# Patient Record
Sex: Male | Born: 1965 | Race: White | Hispanic: No | Marital: Single | Smoking: Never smoker
Health system: Southern US, Community
[De-identification: ages and names within clinical notes are randomized; demographics above are authoritative.]

## PROBLEM LIST (undated history)

## (undated) DIAGNOSIS — R9389 Abnormal findings on diagnostic imaging of other specified body structures: Secondary | ICD-10-CM

## (undated) DIAGNOSIS — M51369 Other intervertebral disc degeneration, lumbar region without mention of lumbar back pain or lower extremity pain: Secondary | ICD-10-CM

## (undated) DIAGNOSIS — D869 Sarcoidosis, unspecified: Secondary | ICD-10-CM

## (undated) DIAGNOSIS — M5136 Other intervertebral disc degeneration, lumbar region: Secondary | ICD-10-CM

## (undated) DIAGNOSIS — M5126 Other intervertebral disc displacement, lumbar region: Secondary | ICD-10-CM

## (undated) HISTORY — DX: Other intervertebral disc displacement, lumbar region: M51.26

## (undated) HISTORY — PX: TONSILLECTOMY: SUR1361

## (undated) HISTORY — PX: CATARACT EXTRACTION: SUR2

## (undated) HISTORY — DX: Other intervertebral disc degeneration, lumbar region without mention of lumbar back pain or lower extremity pain: M51.369

## (undated) HISTORY — DX: Other intervertebral disc degeneration, lumbar region: M51.36

---

## 2000-09-09 HISTORY — PX: REFRACTIVE SURGERY: SHX103

## 2014-10-18 ENCOUNTER — Encounter (INDEPENDENT_AMBULATORY_CARE_PROVIDER_SITE_OTHER): Payer: Self-pay

## 2014-10-18 ENCOUNTER — Encounter: Payer: Self-pay | Admitting: Pulmonary Disease

## 2014-10-18 ENCOUNTER — Ambulatory Visit (INDEPENDENT_AMBULATORY_CARE_PROVIDER_SITE_OTHER): Payer: BLUE CROSS/BLUE SHIELD | Admitting: Pulmonary Disease

## 2014-10-18 VITALS — BP 100/62 | HR 62 | Temp 97.9°F | Ht 73.0 in | Wt 202.4 lb

## 2014-10-18 DIAGNOSIS — R590 Localized enlarged lymph nodes: Secondary | ICD-10-CM

## 2014-10-18 DIAGNOSIS — R599 Enlarged lymph nodes, unspecified: Secondary | ICD-10-CM

## 2014-10-18 DIAGNOSIS — D869 Sarcoidosis, unspecified: Secondary | ICD-10-CM | POA: Insufficient documentation

## 2014-10-18 NOTE — Progress Notes (Signed)
   Subjective:    Patient ID: Alexander Nelson, male    DOB: October 13, 1965, 49 y.o.   MRN: [id number]  HPI The patient is a 49 year old male who I've been asked to see for an abnormal chest x-ray. He was in his usual state of health until approximately 2 months ago when he began to notice visual changes. 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 Blood work was done with an ACE level of 91, and his HIV was nonreactive. The patient then had a chest x-ray which showed bilateral hilar lymphadenopathy, and possibly mediastinal adenopathy. The patient has no significant pulmonary symptoms, and feels that his exertional tolerance is completely normal. He does have a mild dry and tickling cough that comes and goes over the last 6 months. 6 is not overly bothersome to him. He denies any skin changes, joint complaints, or night sweats. The patient does work in [employer], but does not work around [employer].   Review of Systems  Constitutional: Negative for fever and unexpected weight change.  HENT: Negative for congestion, dental problem, ear pain, nosebleeds, postnasal drip, rhinorrhea, sinus pressure, sneezing, sore throat and trouble swallowing.   Eyes: Positive for visual disturbance. Negative for redness and itching.  Respiratory: Positive for cough. Negative for chest tightness, shortness of breath and wheezing.   Cardiovascular: Negative for palpitations and leg swelling.  Gastrointestinal: Negative for nausea and vomiting.  Genitourinary: Negative for dysuria.  Musculoskeletal: Negative for joint swelling.  Skin: Negative for rash.  Neurological: Negative for headaches.  Hematological: Does not bruise/bleed easily.  Psychiatric/Behavioral: Negative for dysphoric mood. The patient is not nervous/anxious.        Objective:   Physical Exam Constitutional:  Well developed, no acute  distress  HENT:  Nares patent without discharge  Oropharynx without exudate, palate and uvula are normal  Eyes:  Perrla, eomi, no scleral icterus  Neck:  No JVD, no TMG  Cardiovascular:  Normal rate, regular rhythm, no rubs or gallops.  No murmurs        Intact distal pulses  Pulmonary :  Normal breath sounds, no stridor or respiratory distress   No rales, rhonchi, or wheezing  Abdominal:  Soft, nondistended, bowel sounds present.  No tenderness noted.   Musculoskeletal:  No lower extremity edema noted.  Lymph Nodes:  No cervical lymphadenopathy noted  Skin:  No cyanosis noted  Neurologic:  Alert, appropriate, moves all 4 extremities without obvious deficit.         Assessment & Plan:

## 2014-10-18 NOTE — Assessment & Plan Note (Signed)
The patient has classic uveitis, mediastinal and hilar lymphadenopathy, and an elevated ACE level. This is all most consistent with sarcoidosis. He has no pulmonary symptoms at this time except minimal cough, and it is unclear whether these 2 are even related. He has no constitutional symptoms, and feels that his breathing is excellent. At this point, I think he needs to have a CT chest to better evaluate his pulmonary abnormalities. Depending upon the results, we will need to proceed with a tissue diagnosis if the patient is agreeable. He and I have had a long conversation about whether a tissue diagnosis is necessary, and I have reviewed with him the various diseases that can mimic sarcoidosis. The patient does work in [employer], but is not involved directly in [employer] which could predispose him to berylliosis.

## 2014-10-18 NOTE — Patient Instructions (Signed)
Will schedule for ct chest at your convenience, and call you with results. Let us know if you feel your cough is worsening.

## 2014-10-27 ENCOUNTER — Ambulatory Visit (INDEPENDENT_AMBULATORY_CARE_PROVIDER_SITE_OTHER)
Admission: RE | Admit: 2014-10-27 | Discharge: 2014-10-27 | Disposition: A | Discharge status: Home / Self Care | Payer: BLUE CROSS/BLUE SHIELD | Source: Ambulatory Visit | Attending: Pulmonary Disease | Admitting: Pulmonary Disease

## 2014-10-27 DIAGNOSIS — R590 Localized enlarged lymph nodes: Secondary | ICD-10-CM

## 2014-10-27 DIAGNOSIS — R599 Enlarged lymph nodes, unspecified: Secondary | ICD-10-CM

## 2014-10-27 MED ORDER — IOHEXOL 300 MG/ML  SOLN
80.0000 mL | Freq: Once | INTRAMUSCULAR | Status: AC | PRN
  Administered 2014-10-27: 80 mL via INTRAVENOUS

## 2014-11-03 ENCOUNTER — Telehealth: Payer: Self-pay | Admitting: Pulmonary Disease

## 2014-11-03 NOTE — Telephone Encounter (Signed)
EBUS 11/14/14@7 :30am pt aware Alexander Nelson

## 2014-11-03 NOTE — Telephone Encounter (Signed)
Pt is wanting to proceed with EBUS.  KC - please advise. Thanks.

## 2014-11-03 NOTE — Telephone Encounter (Signed)
He would prefer March 7 (monday) at 730 am. Almyra FreeLibby, please check on this for me.

## 2014-11-04 ENCOUNTER — Encounter (HOSPITAL_COMMUNITY): Payer: Self-pay | Admitting: *Deleted

## 2014-11-14 ENCOUNTER — Other Ambulatory Visit: Payer: Self-pay

## 2014-11-14 ENCOUNTER — Encounter (HOSPITAL_COMMUNITY)
Admission: RE | Disposition: A | Discharge status: Home / Self Care | Payer: BLUE CROSS/BLUE SHIELD | Source: Ambulatory Visit | Attending: Pulmonary Disease

## 2014-11-14 ENCOUNTER — Ambulatory Visit (HOSPITAL_COMMUNITY): Payer: BLUE CROSS/BLUE SHIELD | Admitting: Anesthesiology

## 2014-11-14 ENCOUNTER — Encounter (HOSPITAL_COMMUNITY): Payer: Self-pay | Admitting: Anesthesiology

## 2014-11-14 ENCOUNTER — Ambulatory Visit (HOSPITAL_COMMUNITY)
Admission: RE | Admit: 2014-11-14 | Discharge: 2014-11-14 | Disposition: A | Discharge status: Home / Self Care | Payer: BLUE CROSS/BLUE SHIELD | Source: Ambulatory Visit | Attending: Pulmonary Disease | Admitting: Pulmonary Disease

## 2014-11-14 DIAGNOSIS — R591 Generalized enlarged lymph nodes: Secondary | ICD-10-CM | POA: Diagnosis present

## 2014-11-14 DIAGNOSIS — R599 Enlarged lymph nodes, unspecified: Secondary | ICD-10-CM

## 2014-11-14 HISTORY — DX: Abnormal findings on diagnostic imaging of other specified body structures: R93.89

## 2014-11-14 HISTORY — PX: ENDOBRONCHIAL ULTRASOUND: SHX5096

## 2014-11-14 LAB — COMPREHENSIVE METABOLIC PANEL
ALT: 17 U/L (ref 0–53)
AST: 19 U/L (ref 0–37)
Albumin: 3.8 g/dL (ref 3.5–5.2)
Alkaline Phosphatase: 52 U/L (ref 39–117)
Anion gap: 5 (ref 5–15)
BUN: 15 mg/dL (ref 6–23)
CO2: 28 mmol/L (ref 19–32)
Calcium: 8.7 mg/dL (ref 8.4–10.5)
Chloride: 105 mmol/L (ref 96–112)
Creatinine, Ser: 0.9 mg/dL (ref 0.50–1.35)
GFR calc Af Amer: >90 mL/min (ref >90)
GFR calc non Af Amer: >90 mL/min (ref >90)
Glucose, Bld: 136 mg/dL — ABNORMAL HIGH (ref 70–99)
Potassium: 4.1 mmol/L (ref 3.5–5.1)
Sodium: 138 mmol/L (ref 135–145)
Total Bilirubin: 0.6 mg/dL (ref 0.3–1.2)
Total Protein: 6.7 g/dL (ref 6.0–8.3)

## 2014-11-14 SURGERY — ENDOBRONCHIAL ULTRASOUND (EBUS)
Anesthesia: General | Laterality: Bilateral

## 2014-11-14 MED ORDER — ONDANSETRON HCL 4 MG/2ML IJ SOLN
INTRAMUSCULAR | Status: AC
  Filled 2014-11-14: qty 2

## 2014-11-14 MED ORDER — LACTATED RINGERS IV SOLN
INTRAVENOUS | Status: DC
  Administered 2014-11-14: 1000 mL via INTRAVENOUS

## 2014-11-14 MED ORDER — LIDOCAINE HCL (CARDIAC) 20 MG/ML IV SOLN
INTRAVENOUS | Status: DC | PRN
  Administered 2014-11-14: 50 mg via INTRAVENOUS

## 2014-11-14 MED ORDER — PROPOFOL 10 MG/ML IV BOLUS
INTRAVENOUS | Status: AC
  Filled 2014-11-14: qty 20

## 2014-11-14 MED ORDER — MIDAZOLAM HCL 2 MG/2ML IJ SOLN
INTRAMUSCULAR | Status: AC
  Filled 2014-11-14: qty 2

## 2014-11-14 MED ORDER — ROCURONIUM BROMIDE 100 MG/10ML IV SOLN
INTRAVENOUS | Status: DC | PRN
  Administered 2014-11-14: 25 mg via INTRAVENOUS

## 2014-11-14 MED ORDER — LACTATED RINGERS IV SOLN
INTRAVENOUS | Status: DC | PRN
  Administered 2014-11-14: 08:00:00 via INTRAVENOUS

## 2014-11-14 MED ORDER — GLYCOPYRROLATE 0.2 MG/ML IJ SOLN
INTRAMUSCULAR | Status: AC
  Filled 2014-11-14: qty 2

## 2014-11-14 MED ORDER — DEXAMETHASONE SODIUM PHOSPHATE 10 MG/ML IJ SOLN
INTRAMUSCULAR | Status: AC
  Filled 2014-11-14: qty 1

## 2014-11-14 MED ORDER — NEOSTIGMINE METHYLSULFATE 10 MG/10ML IV SOLN
INTRAVENOUS | Status: DC | PRN
  Administered 2014-11-14: 3 mg via INTRAVENOUS

## 2014-11-14 MED ORDER — FENTANYL CITRATE 0.05 MG/ML IJ SOLN
INTRAMUSCULAR | Status: AC
  Filled 2014-11-14: qty 5

## 2014-11-14 MED ORDER — PROPOFOL 10 MG/ML IV BOLUS
INTRAVENOUS | Status: DC | PRN
  Administered 2014-11-14: 190 mg via INTRAVENOUS

## 2014-11-14 MED ORDER — MIDAZOLAM HCL 5 MG/5ML IJ SOLN
INTRAMUSCULAR | Status: DC | PRN
  Administered 2014-11-14: 2 mg via INTRAVENOUS

## 2014-11-14 MED ORDER — GLYCOPYRROLATE 0.2 MG/ML IJ SOLN
INTRAMUSCULAR | Status: DC | PRN
  Administered 2014-11-14: 0.4 mg via INTRAVENOUS
  Administered 2014-11-14: 0.2 mg via INTRAVENOUS

## 2014-11-14 MED ORDER — EPHEDRINE SULFATE 50 MG/ML IJ SOLN
INTRAMUSCULAR | Status: AC
  Filled 2014-11-14: qty 1

## 2014-11-14 MED ORDER — ROCURONIUM BROMIDE 100 MG/10ML IV SOLN
INTRAVENOUS | Status: AC
  Filled 2014-11-14: qty 1

## 2014-11-14 MED ORDER — DEXAMETHASONE SODIUM PHOSPHATE 10 MG/ML IJ SOLN
INTRAMUSCULAR | Status: DC | PRN
  Administered 2014-11-14: 10 mg via INTRAVENOUS

## 2014-11-14 MED ORDER — SODIUM CHLORIDE 0.9 % IJ SOLN
INTRAMUSCULAR | Status: AC
  Filled 2014-11-14: qty 10

## 2014-11-14 MED ORDER — ONDANSETRON HCL 4 MG/2ML IJ SOLN
INTRAMUSCULAR | Status: DC | PRN
  Administered 2014-11-14: 4 mg via INTRAVENOUS

## 2014-11-14 MED ORDER — FENTANYL CITRATE 0.05 MG/ML IJ SOLN
INTRAMUSCULAR | Status: DC | PRN
  Administered 2014-11-14: 100 ug via INTRAVENOUS

## 2014-11-14 MED ORDER — NEOSTIGMINE METHYLSULFATE 10 MG/10ML IV SOLN
INTRAVENOUS | Status: AC
  Filled 2014-11-14: qty 1

## 2014-11-14 NOTE — Op Note (Signed)
Dictation #:  [id number]

## 2014-11-14 NOTE — Anesthesia Preprocedure Evaluation (Addendum)
Anesthesia Evaluation  Patient identified by MRN, date of birth, ID band Patient awake    Reviewed: Allergy & Precautions, NPO status , Patient's Chart, lab work & pertinent test results  Airway Mallampati: II  TM Distance: >3 FB Neck ROM: Full    Dental no notable dental hx.    Pulmonary neg pulmonary ROS,  breath sounds clear to auscultation  Pulmonary exam normal       Cardiovascular negative cardio ROS  Rhythm:Regular Rate:Normal     Neuro/Psych negative neurological ROS  negative psych ROS   GI/Hepatic negative GI ROS, Neg liver ROS,   Endo/Other  negative endocrine ROS  Renal/GU negative Renal ROS  negative genitourinary   Musculoskeletal negative musculoskeletal ROS (+)   Abdominal   Peds negative pediatric ROS (+)  Hematology negative hematology ROS (+)   Anesthesia Other Findings   Reproductive/Obstetrics negative OB ROS                             Anesthesia Physical Anesthesia Plan  ASA: I  Anesthesia Plan: General   Post-op Pain Management:    Induction: Intravenous  Airway Management Planned: Oral ETT  Additional Equipment:   Intra-op Plan:   Post-operative Plan: Extubation in OR  Informed Consent: I have reviewed the patients History and Physical, chart, labs and discussed the procedure including the risks, benefits and alternatives for the proposed anesthesia with the patient or authorized representative who has indicated his/her understanding and acceptance.   Dental advisory given  Plan Discussed with: CRNA  Anesthesia Plan Comments: (CT chest reviewed. No airway compression)        Anesthesia Quick Evaluation

## 2014-11-14 NOTE — Anesthesia Procedure Notes (Signed)
Procedure Name: Intubation Date/Time: 11/14/2014 7:45 AM Performed by: Alyha Marines, Nuala AlphaKRISTOPHER Pre-anesthesia Checklist: Patient identified, Emergency Drugs available, Suction available, Patient being monitored and Timeout performed Patient Re-evaluated:Patient Re-evaluated prior to inductionOxygen Delivery Method: Circle system utilized Preoxygenation: Pre-oxygenation with 100% oxygen Intubation Type: IV induction Ventilation: Mask ventilation without difficulty Laryngoscope Size: Mac and 4 Grade View: Grade II Tube type: Oral Tube size: 9.0 mm Number of attempts: 1 Airway Equipment and Method: Stylet Placement Confirmation: ETT inserted through vocal cords under direct vision,  positive ETCO2,  CO2 detector and breath sounds checked- equal and bilateral Secured at: 22 cm Tube secured with: Tape Dental Injury: Teeth and Oropharynx as per pre-operative assessment

## 2014-11-14 NOTE — Anesthesia Postprocedure Evaluation (Signed)
  Anesthesia Post-op Note  Patient: Alexander Nelson  Procedure(s) Performed: Procedure(s) (LRB): ENDOBRONCHIAL ULTRASOUND (Bilateral)  Patient Location: PACU  Anesthesia Type: General  Level of Consciousness: awake and alert   Airway and Oxygen Therapy: Patient Spontanous Breathing  Post-op Pain: mild  Post-op Assessment: Post-op Vital signs reviewed, Patient's Cardiovascular Status Stable, Respiratory Function Stable, Patent Airway and No signs of Nausea or vomiting  Last Vitals:  Filed Vitals:   11/14/14 1020  BP: 91/61  Pulse: 61  Temp:   Resp: 15    Post-op Vital Signs: stable   Complications: No apparent anesthesia complications

## 2014-11-14 NOTE — Transfer of Care (Signed)
Immediate Anesthesia Transfer of Care Note  Patient: Alexander Nelson  Procedure(s) Performed: Procedure(s): ENDOBRONCHIAL ULTRASOUND (Bilateral)  Patient Location: Endoscopy Unit  Anesthesia Type:General  Level of Consciousness: awake, alert  and oriented  Airway & Oxygen Therapy: Patient Spontanous Breathing and Patient connected to face mask oxygen  Post-op Assessment: Report given to RN  Post vital signs: Reviewed and stable  Last Vitals:  Filed Vitals:   11/14/14 0640  BP: 113/66  Pulse: 54  Temp: 36.7 C  Resp: 13    Complications: No apparent anesthesia complications

## 2014-11-14 NOTE — Discharge Instructions (Signed)
Flexible Bronchoscopy, Care After °These instructions give you information on caring for yourself after your procedure. Your doctor may also give you more specific instructions. Call your doctor if you have any problems or questions after your procedure. °HOME CARE °· Do not eat or drink anything for 2 hours after your procedure. If you try to eat or drink before the medicine wears off, food or drink could go into your lungs. You could also burn yourself. °· After 2 hours have passed and when you can cough and gag normally, you may eat soft food and drink liquids slowly. °· The day after the test, you may eat your normal diet. °· You may do your normal activities. °· Keep all doctor visits. °GET HELP RIGHT AWAY IF: °· You get more and more short of breath. °· You get light-headed. °· You feel like you are going to pass out (faint). °· You have chest pain. °· You have new problems that worry you. °· You cough up more than a little blood. °· You cough up more blood than before. °MAKE SURE YOU: °· Understand these instructions. °· Will watch your condition. °· Will get help right away if you are not doing well or get worse. °Document Released: 06/23/2009 Document Revised: 08/31/2013 Document Reviewed: 04/30/2013 °ExitCare® Patient Information ©2015 [employer]. This information is not intended to replace advice given to you by your health care provider. Make sure you discuss any questions you have with your health care provider. ° °

## 2014-11-14 NOTE — H&P (Signed)
Subjective:    Patient ID: Alexander Nelson, male DOB: April 19, 1966, 49 y.o. MRN: [id number]  HPI The patient is a 49 year old male who I've been asked to see for an abnormal chest x-ray. He was in his usual state of health until approximately 2 months ago when he began to notice visual changes. 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 Blood work was done with an ACE level of 91, and his HIV was nonreactive. The patient then had a chest x-ray which showed bilateral hilar lymphadenopathy, and possibly mediastinal adenopathy. The patient has no significant pulmonary symptoms, and feels that his exertional tolerance is completely normal. He does have a mild dry and tickling cough that comes and goes over the last 6 months. 6 is not overly bothersome to him. He denies any skin changes, joint complaints, or night sweats. 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   Review of Systems  Constitutional: Negative for fever and unexpected weight change.  HENT: Negative for congestion, dental problem, ear pain, nosebleeds, postnasal drip, rhinorrhea, sinus pressure, sneezing, sore throat and trouble swallowing.  Eyes: Positive for visual disturbance. Negative for redness and itching.  Respiratory: Positive for cough. Negative for chest tightness, shortness of breath and wheezing.  Cardiovascular: Negative for palpitations and leg swelling.  Gastrointestinal: Negative for nausea and vomiting.  Genitourinary: Negative for dysuria.  Musculoskeletal: Negative for joint swelling.  Skin: Negative for rash.  Neurological: Negative for headaches.  Hematological: Does not bruise/bleed easily.  Psychiatric/Behavioral: Negative for dysphoric mood. The patient is not nervous/anxious.       Objective:   Physical Exam Constitutional: Well developed, no acute  distress  HENT: Nares patent without discharge Oropharynx without exudate, palate and uvula are normal  Eyes: Perrla, eomi, no scleral icterus  Neck: No JVD, no TMG  Cardiovascular: Normal rate, regular rhythm, no rubs or gallops. No murmurs  Intact distal pulses  Pulmonary : Normal breath sounds, no stridor or respiratory distress No rales, rhonchi, or wheezing  Abdominal: Soft, nondistended, bowel sounds present. No tenderness noted.   Musculoskeletal: No lower extremity edema noted.  Lymph Nodes: No cervical lymphadenopathy noted  Skin: No cyanosis noted  Neurologic: Alert, appropriate, moves all 4 extremities without obvious deficit.         Assessment & Plan:            Revision History       Date/Time User Action    > 10/18/2014 5:30 PM Barbaraann Share, MD Sign     10/18/2014 4:27 PM Karleen Hampshire, CMA Sign at close encounter              Mediastinal lymphadenopathy - Barbaraann Share, MD at 10/18/2014 5:24 PM     Status: Written Related Problem: Mediastinal lymphadenopathy   Expand All Collapse All   The patient has classic uveitis, mediastinal and hilar lymphadenopathy, and an elevated ACE level. This is all most consistent with sarcoidosis. He has no pulmonary symptoms at this time except minimal cough, and it is unclear whether these 2 are even related. He has no constitutional symptoms, and feels that his breathing is excellent. At this point, I think he needs to have a CT chest to better evaluate his pulmonary abnormalities. Depending upon the results, we will need to proceed with a tissue diagnosis if the patient is agreeable. He and I have  had a long conversation about whether a tissue diagnosis is necessary, and I have reviewed with him the various diseases that can mimic sarcoidosis. The patient does work in [employer], but is not involved directly in  [employer] which could predispose him to berylliosis.              Not recorded     Orders Placed This Encounter     Future Labs/Procedures Expected by Expires    CT Chest W Contrast [IMG202 Custom] As directed 12/17/2015         Patient Instructions     Will schedule for ct chest at your convenience, and call you with results. Let us know if you feel your cough is worsening.    Pt seen and examined this am.  Discussed procedure, risks, and benefits.  He agrees to proceed with EBUS with biopsy.

## 2014-11-15 ENCOUNTER — Encounter (HOSPITAL_COMMUNITY): Payer: Self-pay | Admitting: Pulmonary Disease

## 2014-11-15 NOTE — Op Note (Signed)
NAME:  Cyndia DiverROBINSON, Ravin              ACCOUNT NO.:  [id number]  MEDICAL RECORD NO.:  [id number]  LOCATION:  [facility]                         [facility]  PHYSICIAN:  Barbaraann ShareKeith M. Jaleil Renwick, MD,FCCPDATE OF BIRTH:  12/28/1965  DATE OF PROCEDURE:  11/14/2014 DATE OF DISCHARGE:  11/14/2014                              OPERATIVE REPORT   PROCEDURE:  Flexible fiberoptic bronchoscopy with endobronchial ultrasound and biopsy.  OPERATOR:  Barbaraann ShareKeith M. Keren Alverio, MD,FCCP  ANESTHESIA:  General.  DESCRIPTION OF PROCEDURE:  After obtaining informed consent and under close cardiopulmonary monitoring, the patient was administered general anesthesia with endotracheal tube in place.  The fiberoptic scope was passed through the endotracheal tube and examination of airways revealed no endobronchial process seen.  The scope was then removed and the endobronchial ultrasound scope was then passed through the endotracheal tube, and imaging of the 7 and R4 stations were done with excellent visualization seen.  Multiple transbronchial needle aspirations were done from the R4 node as well as the 7 station with excellent material being seen.  Multiple passes were made through each station and placed in cytolyte for permanent section.  Overall, the patient tolerated the procedure quite well and there were no complications.     Barbaraann ShareKeith M. Jacarius Handel, MD,FCCP     [facility]/MED[facility]  D:  11/14/2014  T:  11/15/2014  Job:  [id number]

## 2014-12-22 ENCOUNTER — Ambulatory Visit: Payer: BLUE CROSS/BLUE SHIELD | Admitting: Internal Medicine

## 2015-01-16 ENCOUNTER — Telehealth: Payer: Self-pay | Admitting: Pulmonary Disease

## 2015-01-16 NOTE — Telephone Encounter (Signed)
Alexander Nelson, LMOM about me leaving, and that I would like for him to followup with Dr. Kendrick FriesMcQuaid in 6mos if that is ok with him.  Please try and call him to arrange.  Thanks.

## 2015-01-18 NOTE — Telephone Encounter (Signed)
Called pt and recall placed in EPIC.

## 2015-08-24 ENCOUNTER — Encounter: Payer: Self-pay | Admitting: Pulmonary Disease

## 2015-08-24 ENCOUNTER — Ambulatory Visit (INDEPENDENT_AMBULATORY_CARE_PROVIDER_SITE_OTHER): Payer: BLUE CROSS/BLUE SHIELD | Admitting: Pulmonary Disease

## 2015-08-24 VITALS — BP 126/68 | HR 59 | Ht 73.0 in | Wt 184.0 lb

## 2015-08-24 DIAGNOSIS — D869 Sarcoidosis, unspecified: Secondary | ICD-10-CM

## 2015-08-24 NOTE — Assessment & Plan Note (Signed)
Mr. Roxan HockeyRobinson follows up with us today for his constellation of symptoms which have been felt to be consistent with sarcoidosis. Specifically, he has had uveitis, some joint aches, and mediastinal lymphadenopathy which on endobronchial ultrasound-guided needle aspiration showed "a rare granuloma-like fragment". Fortunately he has not had significant pulmonary manifestations of this but the uveitis has been a significant problem. He continues to struggle with uveitis.  I am concerned about the duration of prednisone he's been taking this year as it is putting him at increased risk for long-term side effects. I agree with the suggestion (apparently) from his ophthalmologist that we need to change to a steroid sparing agent. 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  Plan: I have provided him with information on my standard dosing for methotrexate Follow-up with ophthalmology next week Follow-up with me in March 2017 with a baseline pulmonary function test and a repeat chest x-ray.  > 50% of time spent face to face in a 25 minute visit

## 2015-08-24 NOTE — Patient Instructions (Signed)
Here is my general instructions for patients taking methotrexate for their sarcoidosis.  I recommend that you show this to Dr. Lelon PerlaSaunders:  After you have heard back from our office about your labs, start taking methotrexate this way: Take 7.5mg  by mouth once per week for two weeks, Then take 10mg  by mouth once per week for two weeks,  Then take 12.5mg  by mouth once per week for two weeks, Then take 15mg  by mouth once per week  While you are take the methotrexate, you need to take Folic acid (folate) 1mg  daily  You will also need to take bactrim one tablet by mouth three times per week  We will order blood work to be monitored monthly for the first three weeks you are on the methotrexate  Do not drink alcohol while taking this medicine  We will arrange a pulmonary function test and a chest X-ray for your visit in March 2017

## 2015-08-24 NOTE — Progress Notes (Signed)
Subjective:    Patient ID: Alexander Nelson, male    DOB: 1965-12-18, 49 y.o.   MRN: [id number]  Synopsis: Former patient of Dr. Shelle Nelson who has a constellation of symptoms that seem consistent with sarcoidosis. Dr. Shelle Nelson summarized his clinical situation as follows: CXR 2016:  Mediastinal LN, no obvious infiltrate. 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 Follow-up    former [facility] pt being treated for mediastinal lymphadenopathy.  pt c/o nonprod cough X2 months.  Pt seeing opthamologist for eye pain.      Currently taking  daily of prednisone. He has some insomonia with prednisone but not right now. Some cough. NO dyspnea.  NO rash, but he has had some joint aches.  He denies shortness of breath of any kind he has been exercising more frequently and he says that it has been going fairly well. He is not limited by shortness of breath.  The most significant problem he has been experiencing has been increasing floaters with his vision. He says in general he feels that his vision is deteriorating. He has been taking significant amounts of prednisone throughout the course of the summer. He was on doses as high as 80 mg daily. He said that when he was taking any had significant insomnia.   Past Medical History  Diagnosis Date  . Abnormal chest x-ray 3 weeks ago      Review of Systems     Objective:   Physical Exam  Filed Vitals:   08/24/15 1041  BP: 126/68  Pulse: 59  Height:  (1.854 m)  Weight: 184 lb (83.462 kg)  SpO2: 98%  RA  Gen: well appearing HENT: OP clear, TM's clear, neck supple PULM: CTA B, normal percussion CV: RRR, no mgr, trace edema GI: BS+, soft, nontender Derm: no cyanosis or rash Psyche: normal mood and affect  February 2016 CT chest images personally reviewed showing significant mediastinal  lymphadenopathy and scattered micronodules in a bronchovascular distribution. Pathology report reviewed from lymph node biopsy showing "a rare granuloma-like fragment".      Assessment & Plan:  Sarcoidosis [facility]) Alexander Nelson follows up with [place] today for his constellation of symptoms which have been felt to be consistent with sarcoidosis. Specifically, he has had uveitis, some joint aches, and mediastinal lymphadenopathy which on endobronchial ultrasound-guided needle aspiration showed "a rare granuloma-like fragment". Fortunately he has not had significant pulmonary manifestations of this but the uveitis has been a significant problem. He continues to struggle with uveitis.  I am concerned about the duration of prednisone he's been taking this year as it is putting him at increased risk for long-term side effects. I agree with the suggestion (apparently) from his ophthalmologist that we need to change to a steroid sparing agent. 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  Plan: I have provided him with information on my standard dosing for methotrexate Follow-up with ophthalmology next week Follow-up with me in March 2017 with a baseline pulmonary function test and a repeat chest x-ray.  > 50% of time spent face to face in a 25 minute visit     Current outpatient prescriptions:  .  prednisoLONE acetate (PRED FORTE) 1 % ophthalmic suspension, Place 1 drop into both eyes every 4 (four) hours.,  Disp: , Rfl:  .  predniSONE (DELTASONE) 10 MG tablet, Take 25 mg by mouth daily with breakfast., Disp: , Rfl:  .  traZODone (DESYREL) 50 MG tablet, Take 50 mg by mouth at bedtime as needed for sleep., Disp: , Rfl:

## 2015-12-04 ENCOUNTER — Ambulatory Visit (INDEPENDENT_AMBULATORY_CARE_PROVIDER_SITE_OTHER): Payer: BLUE CROSS/BLUE SHIELD | Admitting: Pulmonary Disease

## 2015-12-04 ENCOUNTER — Ambulatory Visit (INDEPENDENT_AMBULATORY_CARE_PROVIDER_SITE_OTHER)
Admission: RE | Admit: 2015-12-04 | Discharge: 2015-12-04 | Disposition: A | Discharge status: Home / Self Care | Payer: BLUE CROSS/BLUE SHIELD | Source: Ambulatory Visit | Attending: Pulmonary Disease | Admitting: Pulmonary Disease

## 2015-12-04 ENCOUNTER — Encounter: Payer: Self-pay | Admitting: Pulmonary Disease

## 2015-12-04 VITALS — BP 102/62 | HR 58 | Ht 74.5 in | Wt 190.0 lb

## 2015-12-04 DIAGNOSIS — D869 Sarcoidosis, unspecified: Secondary | ICD-10-CM | POA: Diagnosis not present

## 2015-12-04 LAB — PULMONARY FUNCTION TEST
DL/VA % pred: 95 %
DL/VA: 4.66 ml/min/mmHg/L
DLCO cor % pred: 81 %
DLCO cor: 31.44 ml/min/mmHg
DLCO unc % pred: 83 %
DLCO unc: 32.12 ml/min/mmHg
FEF 25-75 Post: 4.56 L/sec
FEF 25-75 Pre: 3.34 L/sec
FEF2575-%Change-Post: 36 %
FEF2575-%Pred-Post: 114 %
FEF2575-%Pred-Pre: 84 %
FEV1-%Change-Post: 7 %
FEV1-%Pred-Post: 100 %
FEV1-%Pred-Pre: 93 %
FEV1-Post: 4.6 L
FEV1-Pre: 4.28 L
FEV1FVC-%Change-Post: 9 %
FEV1FVC-%Pred-Pre: 97 %
FEV6-%Change-Post: 0 %
FEV6-%Pred-Post: 97 %
FEV6-%Pred-Pre: 98 %
FEV6-Post: 5.53 L
FEV6-Pre: 5.58 L
FEV6FVC-%Change-Post: 0 %
FEV6FVC-%Pred-Post: 102 %
FEV6FVC-%Pred-Pre: 101 %
FVC-%Change-Post: -1 %
FVC-%Pred-Post: 94 %
FVC-%Pred-Pre: 96 %
FVC-Post: 5.55 L
FVC-Pre: 5.65 L
Post FEV1/FVC ratio: 83 %
Post FEV6/FVC ratio: 100 %
Pre FEV1/FVC ratio: 76 %
Pre FEV6/FVC Ratio: 99 %
RV % pred: 76 %
RV: 1.73 L
TLC % pred: 94 %
TLC: 7.44 L

## 2015-12-04 NOTE — Progress Notes (Signed)
Subjective:    Patient ID: Alexander Nelson, male    DOB: [birth year], 50 y.o.   MRN: [id number]  Synopsis: Former patient of Dr. Shelle Iron who has a constellation of symptoms that seem consistent with sarcoidosis. Dr. Shelle Iron summarized his clinical situation as follows: CXR 2016:  Mediastinal LN, no obvious infiltrate. 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 Follow-up    pt has sarcoidosis. Still has occ dry cough. Denies any wheezing, sob, cp/tightness. Had PFT today.     Dessie says that he's been doing well from a respiratory standpoint. He has no cough, no shortness of breath and he is remaining active with heavy exercise including swimming and cross fit. He is not limited with this because of breathing.  However, he continues to have trouble with his left eye in particular. He says that he is able to use a lower dose of prednisone now that he is using methotrexate. He started on oral methotrexate but had a transition to subcutaneous injections and he says that seems to be doing a better job. He gives himself the injections at home. His rheumatologist has been following his blood work to ensure there is no evidence of toxicity from the methotrexate. He is currently not taking PCP prophylaxis.    Past Medical History  Diagnosis Date  . Abnormal chest x-ray 3 weeks ago      Review of Systems     Objective:   Physical Exam  Filed Vitals:   12/04/15 1230  BP: 102/62  Pulse: 58  Height: 6' 2.5" (1.892 m)  Weight: 190 lb (86.183 kg)  SpO2: 96%  RA  Gen: well appearing HENT: OP clear, TM's clear, neck supple PULM: CTA B, normal percussion CV: RRR, no mgr, trace edema GI: BS+, soft, nontender Derm: no cyanosis or rash Psyche: normal mood and affect   March 2017 pulmonary function testing ratio 83%, FEV1 4.60 L (100%  predicted, 7% change with bronchodilator), FVC 5.55 L (94% predicted), total lung capacity 7.44 L (94% predicted), DLCO 32.12 (83% predicted).       Assessment & Plan:  Sarcoidosis (HCC) Fortunately he has no respiratory symptoms and today's lung function testing was completely normal.  He seems to have primarily ocular manifestations from his sarcoidosis though he did have small pulmonary nodules and lymphadenopathy on his chest CT.  Plan: At this time I agree with continuing subcutaneous methotrexate as directed by his rheumatologist I think we need to start PCP prophylaxis if he is going to remain on prednisone at a dose higher than 20 mg daily, particularly with the concomitant use of methotrexate If he is to use methotrexate and another immunosuppressant agent such as Humira or Remicade and I absolutely recommend the use of PCP prophylaxis with Bactrim thrice weekly  Follow-up with me in 6 months Repeat PFT in one year     Current outpatient prescriptions:  .  Cholecalciferol (VITAMIN D3) 5000 units TABS, Take 1 tablet by mouth daily., Disp: , Rfl:  .  EPINEPHrine 0.3 mg/0.3 mL IJ SOAJ injection, Inject into the muscle as needed., Disp: , Rfl: 2 .  folic acid (FOLVITE) 1 MG tablet, Take 2 tablets by mouth daily., Disp: , Rfl: 4 .  Methotrexate, PF, 25 MG/0.5ML SOAJ, Inject 25 mg into the skin once a week., Disp: , Rfl:  .  predniSONE (DELTASONE)  5 MG tablet, Take 5 mg by mouth daily with breakfast. Take 4.5 tablets daily, Disp: , Rfl:  .  traZODone (DESYREL) 50 MG tablet, Take 50 mg by mouth at bedtime as needed for sleep., Disp: , Rfl:

## 2015-12-04 NOTE — Patient Instructions (Signed)
I will call Dr. Corliss Skainseveshwar regarding using PCP prophylaxis We will arrange a PFT in one year We will call you with the results of your Chest X-ray We will see you back in 6 months

## 2015-12-04 NOTE — Assessment & Plan Note (Signed)
Fortunately he has no respiratory symptoms and today's lung function testing was completely normal.  He seems to have primarily ocular manifestations from his sarcoidosis though he did have small pulmonary nodules and lymphadenopathy on his chest CT.  Plan: At this time I agree with continuing subcutaneous methotrexate as directed by his rheumatologist I think we need to start PCP prophylaxis if he is going to remain on prednisone at a dose higher than 20 mg daily, particularly with the concomitant use of methotrexate If he is to use methotrexate and another immunosuppressant agent such as Humira or Remicade and I absolutely recommend the use of PCP prophylaxis with Bactrim thrice weekly  Follow-up with me in 6 months Repeat PFT in one year

## 2015-12-04 NOTE — Progress Notes (Signed)
PFT done today. 

## 2016-03-14 ENCOUNTER — Ambulatory Visit: Payer: Self-pay | Admitting: General Surgery

## 2016-03-14 NOTE — H&P (Signed)
Alexander SingDavid Nelson 03/07/2016 2:06 PM Location: [facility] Patient #: [id number] DOB: 14-Apr-1966 Divorced / Language: [place] / Race: White Male   History of Present Illness Alexander Nelson(Alexander Nelson J. Alexander Niese MD; 03/07/2016 2:34 PM) Patient words: hernia.  The patient is a 50 year old male.  Note:He is self referred and comes in for evaluation of bilateral inguinal hernias. He had these diagnosed in the past but they were small and asymptomatic. Recently, the left-sided inguinal hernia is becoming larger and uncomfortable/symptomatic. No difficulty with urination. No chronic constipation. 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 He has bilateral cataracts. He is on prednisone 10 mg a day. He is going to see his ophthalmologist tomorrow regarding cataract surgery. He has good exercise tolerance and runs for exercise.  Other Problems (Alexander Eversole, LPN; 1/47/82956/29/2017 6:212:06 PM) Other disease, cancer, significant illness  Past Surgical History (Alexander Eversole, LPN; 3/08/65786/29/2017 4:692:06 PM) Tonsillectomy Vasectomy  Diagnostic Studies History (Alexander Eversole, LPN; 6/29/52846/29/2017 1:322:06 PM) Colonoscopy never  Allergies (Alexander Eversole, LPN; 4/40/10276/29/2017 2:532:07 PM) No Known Drug Allergies06/29/2017  Medication History (Alexander Eversole, LPN; 6/64/40346/29/2017 7:422:08 PM) Folic Acid (1MG  Tablet, Oral) Active. Methotrexate (2.5MG  Tablet, Oral) Active. PredniSONE (5MG  Tablet, Oral) Active. Vitamin D (1000UNIT Tablet, Oral) Active. Medications Reconciled  Social History (Alexander Pillingmmie Eversole, LPN; 5/95/63876/29/2017 5:642:06 PM) Alcohol use Occasional alcohol use. Caffeine use Coffee, Tea. Illicit drug use Remotely quit drug use. Tobacco use Never smoker.  Family History Alexander Nelson(Alexander Eversole, LPN; 3/32/95186/29/2017 8:412:06 PM) Diabetes Mellitus Mother. Heart Disease Father. Kidney Disease Brother. Migraine Headache Daughter.    Review of Systems (Alexander Eversole LPN; 6/60/63016/29/2017 6:012:06 PM) General Not  Present- Appetite Loss, Chills, Fatigue, Fever, Night Sweats, Weight Gain and Weight Loss. Skin Not Present- Change in Wart/Mole, Dryness, Hives, Jaundice, New Lesions, Non-Healing Wounds, Rash and Ulcer. HEENT Not Present- Earache, Hearing Loss, Hoarseness, Nose Bleed, Oral Ulcers, Ringing in the Ears, Seasonal Allergies, Sinus Pain, Sore Throat, Visual Disturbances, Wears glasses/contact lenses and Yellow Eyes. Respiratory Not Present- Bloody sputum, Chronic Cough, Difficulty Breathing, Snoring and Wheezing. Breast Not Present- Breast Mass, Breast Pain, Nipple Discharge and Skin Changes. Gastrointestinal Not Present- Abdominal Pain, Bloating, Bloody Stool, Change in Bowel Habits, Chronic diarrhea, Constipation, Difficulty Swallowing, Excessive gas, Gets full quickly at meals, Hemorrhoids, Indigestion, Nausea, Rectal Pain and Vomiting. Male Genitourinary Not Present- Blood in Urine, Change in Urinary Stream, Frequency, Impotence, Nocturia, Painful Urination, Urgency and Urine Leakage. Endocrine Not Present- Cold Intolerance, Excessive Hunger, Hair Changes, Heat Intolerance, Hot flashes and New Diabetes. Hematology Not Present- Blood Thinners, Easy Bruising, Excessive bleeding, Gland problems, HIV and Persistent Infections.  Vitals (Alexander Eversole LPN; 0/93/23556/29/2017 7:322:07 PM) 03/07/2016 2:07 PM Weight: 184.4 lb Height: 73in Body Surface Area: 2.08 m Body Mass Index: 24.33 kg/m  Temp.: 97.66F(Oral)  Pulse: 64 (Regular)  BP: 102/62 (Sitting, Left Arm, Standard)       Physical Exam Alexander Nelson(Alexander Nelson J. Dariella Gillihan MD; 03/07/2016 2:36 PM) The physical exam findings are as follows: Note:General: WDWN in NAD. Pleasant and cooperative.  HEENT: Alexander Nelson/AT, no external nasal or ear masses, mucous membranes are moist  EYES: EOMI, pupils normal  CV: RRR, no murmur, no edema  CHEST: Breath sounds equal and clear. Respirations nonlabored.  ABDOMEN: Soft, no umbilical hernias.  GU: Small, reducible  right inguinal bulge. Moderate sized, reducible left inguinal bulge  SKIN: No jaundice.  NEUROLOGIC: Alert and oriented, answers questions appropriately, normal gait and station.  PSYCHIATRIC: Normal mood, affect , and behavior.    Assessment &  Plan Alexander Nelson(Alexander Nelson J. Alexander Hurd MD; 03/07/2016 2:38 PM) NON-RECURRENT BILATERAL INGUINAL HERNIA WITHOUT OBSTRUCTION OR GANGRENE (K40.20) Impression: Left side is getting larger and is symptomatic. Also has cataracts and is seeing his ophthalmologist tomorrow to talk about cataract surgery. He is interested in hernia repair. We discussed the small chance of incarceration and what to do about that.  Plan: We talked about doing a laparoscopic bilateral inguinal hernia with mesh. I have explained the procedure, risks, and aftercare of inguinal hernia repair. Risks include but are not limited to bleeding, infection, wound problems, anesthesia, recurrence, bladder or intestine injury, urinary retention, testicular dysfunction, chronic pain, mesh problems.  As for the timing of the surgery, his Ophthalmologist has decreased his Prednisone to 5 mg and stated he can have the hernia surgery prior to his cataract surgery.  Alexander Peaceodd Naimah Yingst, MD

## 2016-03-28 NOTE — Patient Instructions (Signed)
Alexander SingDavid Craze  03/28/2016   Your procedure is scheduled on: 04/04/2016    [facility]  Entrance take [place]  elevators to 3rd floor to  Short Stay Center at     0530 AM.  Call this number if you have problems the morning of surgery [phone]   Remember: ONLY 1 PERSON MAY GO WITH YOU TO SHORT STAY TO GET  READY MORNING OF YOUR SURGERY.  Do not eat food or drink liquids :After Midnight.     Take these medicines the morning of surgery with A SIP OF WATER: none                                 You may not have any metal on your body including hair pins and              piercings  Do not wear jewelry, , lotions, powders or perfumes, deodorant             .              Men may shave face and neck.   Do not bring valuables to the hospital. [facility]  IS NOT             RESPONSIBLE   FOR VALUABLES.  Contacts, dentures or bridgework may not be worn into surgery.      Patients discharged the day of surgery will not be allowed to drive home.  Name and phone number of your driver:  Special Instructions: coughing and deep breathing exercises, leg exercises              Please read over the following fact sheets you were given: _____________________________________________________________________             [facility] Pre[facility] Before surgery, you can play an important role.  Because skin is not sterile, your skin needs to be as free of germs as possible.  You can reduce the number of germs on your skin by washing with CHG (chlorahexidine gluconate) soap before surgery.  CHG is an antiseptic cleaner which kills germs and bonds with the skin to continue killing germs even after washing. Please DO NOT use if you have an allergy to CHG or antibacterial soaps.  If your skin becomes reddened/irritated stop using the CHG and inform your nurse when you arrive at Short Stay. Do not shave (including legs and underarms) for at least 48 hours prior  to the first CHG shower.  You may shave your face/neck. Please follow these instructions carefully:  1.  Shower with CHG Soap the night before surgery and the  morning of Surgery.  2.  If you choose to wash your hair, wash your hair first as usual with your  normal  shampoo.  3.  After you shampoo, rinse your hair and body thoroughly to remove the  shampoo.                           4.  Use CHG as you would any other liquid soap.  You can apply chg directly  to the skin and wash                       Gently with a scrungie or  clean washcloth.  5.  Apply the CHG Soap to your body ONLY FROM THE NECK DOWN.   Do not use on face/ open                           Wound or open sores. Avoid contact with eyes, ears mouth and genitals (private parts).                       Wash face,  Genitals (private parts) with your normal soap.             6.  Wash thoroughly, paying special attention to the area where your surgery  will be performed.  7.  Thoroughly rinse your body with warm water from the neck down.  8.  DO NOT shower/wash with your normal soap after using and rinsing off  the CHG Soap.                9.  Pat yourself dry with a clean towel.            10.  Wear clean pajamas.            11.  Place clean sheets on your bed the night of your first shower and do not  sleep with pets. Day of Surgery : Do not apply any lotions/deodorants the morning of surgery.  Please wear clean clothes to the hospital/surgery center.  FAILURE TO FOLLOW THESE INSTRUCTIONS MAY RESULT IN THE CANCELLATION OF YOUR SURGERY PATIENT SIGNATURE_________________________________  NURSE SIGNATURE__________________________________  ________________________________________________________________________

## 2016-03-29 ENCOUNTER — Encounter (HOSPITAL_COMMUNITY)
Admission: RE | Admit: 2016-03-29 | Discharge: 2016-03-29 | Disposition: A | Discharge status: Home / Self Care | Payer: BLUE CROSS/BLUE SHIELD | Source: Ambulatory Visit | Attending: General Surgery | Admitting: General Surgery

## 2016-03-29 ENCOUNTER — Encounter (HOSPITAL_COMMUNITY): Payer: Self-pay

## 2016-03-29 DIAGNOSIS — K4020 Bilateral inguinal hernia, without obstruction or gangrene, not specified as recurrent: Secondary | ICD-10-CM | POA: Diagnosis not present

## 2016-03-29 DIAGNOSIS — Z01812 Encounter for preprocedural laboratory examination: Secondary | ICD-10-CM | POA: Diagnosis present

## 2016-03-29 HISTORY — DX: Sarcoidosis, unspecified: D86.9

## 2016-03-29 LAB — BASIC METABOLIC PANEL
Anion gap: 6 (ref 5–15)
BUN: 23 mg/dL — ABNORMAL HIGH (ref 6–20)
CO2: 29 mmol/L (ref 22–32)
Calcium: 9.5 mg/dL (ref 8.9–10.3)
Chloride: 103 mmol/L (ref 101–111)
Creatinine, Ser: 1.14 mg/dL (ref 0.61–1.24)
GFR calc Af Amer: >60 mL/min (ref >60)
GFR calc non Af Amer: >60 mL/min (ref >60)
Glucose, Bld: 97 mg/dL (ref 65–99)
Potassium: 4.4 mmol/L (ref 3.5–5.1)
Sodium: 138 mmol/L (ref 135–145)

## 2016-03-29 LAB — CBC WITH DIFFERENTIAL/PLATELET
Basophils Absolute: 0 10*3/uL (ref 0.0–0.1)
Basophils Relative: 0 %
Eosinophils Absolute: 0.2 10*3/uL (ref 0.0–0.7)
Eosinophils Relative: 3 %
HCT: 38.1 % — ABNORMAL LOW (ref 39.0–52.0)
Hemoglobin: 13 g/dL (ref 13.0–17.0)
Lymphocytes Relative: 12 %
Lymphs Abs: 0.8 10*3/uL (ref 0.7–4.0)
MCH: 30.8 pg (ref 26.0–34.0)
MCHC: 34.1 g/dL (ref 30.0–36.0)
MCV: 90.3 fL (ref 78.0–100.0)
Monocytes Absolute: 0.7 10*3/uL (ref 0.1–1.0)
Monocytes Relative: 10 %
Neutro Abs: 5.1 10*3/uL (ref 1.7–7.7)
Neutrophils Relative %: 75 %
Platelets: 202 10*3/uL (ref 150–400)
RBC: 4.22 MIL/uL (ref 4.22–5.81)
RDW: 13.1 % (ref 11.5–15.5)
WBC: 6.8 10*3/uL (ref 4.0–10.5)

## 2016-03-29 NOTE — Progress Notes (Signed)
BMP results done 03/29/16- EPIC

## 2016-03-29 NOTE — Progress Notes (Signed)
CXR-11/2015- EPIC  LOV with pulmonary- 12/04/15- EPIC

## 2016-04-04 ENCOUNTER — Encounter (HOSPITAL_COMMUNITY): Payer: Self-pay | Admitting: *Deleted

## 2016-04-04 ENCOUNTER — Ambulatory Visit (HOSPITAL_COMMUNITY): Payer: BLUE CROSS/BLUE SHIELD | Admitting: Anesthesiology

## 2016-04-04 ENCOUNTER — Encounter (HOSPITAL_COMMUNITY)
Admission: RE | Disposition: A | Discharge status: Home / Self Care | Payer: Self-pay | Source: Ambulatory Visit | Attending: General Surgery

## 2016-04-04 ENCOUNTER — Ambulatory Visit (HOSPITAL_COMMUNITY)
Admission: RE | Admit: 2016-04-04 | Discharge: 2016-04-04 | Disposition: A | Discharge status: Home / Self Care | Payer: BLUE CROSS/BLUE SHIELD | Source: Ambulatory Visit | Attending: General Surgery | Admitting: General Surgery

## 2016-04-04 DIAGNOSIS — K4020 Bilateral inguinal hernia, without obstruction or gangrene, not specified as recurrent: Secondary | ICD-10-CM

## 2016-04-04 DIAGNOSIS — Z79899 Other long term (current) drug therapy: Secondary | ICD-10-CM | POA: Diagnosis not present

## 2016-04-04 DIAGNOSIS — Z7952 Long term (current) use of systemic steroids: Secondary | ICD-10-CM | POA: Diagnosis not present

## 2016-04-04 HISTORY — PX: INSERTION OF MESH: SHX5868

## 2016-04-04 HISTORY — PX: INGUINAL HERNIA REPAIR: SHX194

## 2016-04-04 SURGERY — REPAIR, HERNIA, INGUINAL, BILATERAL, LAPAROSCOPIC
Anesthesia: General | Site: Groin | Laterality: Bilateral

## 2016-04-04 MED ORDER — ACETAMINOPHEN 10 MG/ML IV SOLN
1000.0000 mg | Freq: Once | INTRAVENOUS | Status: AC
  Administered 2016-04-04: 1000 mg via INTRAVENOUS

## 2016-04-04 MED ORDER — ONDANSETRON HCL 4 MG/2ML IJ SOLN
INTRAMUSCULAR | Status: DC | PRN
  Administered 2016-04-04: 4 mg via INTRAVENOUS

## 2016-04-04 MED ORDER — DEXAMETHASONE SODIUM PHOSPHATE 10 MG/ML IJ SOLN
INTRAMUSCULAR | Status: AC
  Filled 2016-04-04: qty 1

## 2016-04-04 MED ORDER — PROPOFOL 10 MG/ML IV BOLUS
INTRAVENOUS | Status: AC
  Filled 2016-04-04: qty 20

## 2016-04-04 MED ORDER — ONDANSETRON HCL 4 MG/2ML IJ SOLN
INTRAMUSCULAR | Status: AC
  Filled 2016-04-04: qty 2

## 2016-04-04 MED ORDER — MIDAZOLAM HCL 2 MG/2ML IJ SOLN
INTRAMUSCULAR | Status: AC
  Filled 2016-04-04: qty 2

## 2016-04-04 MED ORDER — ROCURONIUM BROMIDE 100 MG/10ML IV SOLN
INTRAVENOUS | Status: DC | PRN
  Administered 2016-04-04 (×3): 10 mg via INTRAVENOUS
  Administered 2016-04-04: 50 mg via INTRAVENOUS
  Administered 2016-04-04: 10 mg via INTRAVENOUS

## 2016-04-04 MED ORDER — ACETAMINOPHEN 325 MG PO TABS
650.0000 mg | ORAL_TABLET | ORAL | Status: DC | PRN
  Administered 2016-04-04: 650 mg via ORAL
  Filled 2016-04-04: qty 2

## 2016-04-04 MED ORDER — LIDOCAINE HCL (CARDIAC) 20 MG/ML IV SOLN
INTRAVENOUS | Status: AC
  Filled 2016-04-04: qty 5

## 2016-04-04 MED ORDER — 0.9 % SODIUM CHLORIDE (POUR BTL) OPTIME
TOPICAL | Status: DC | PRN
  Administered 2016-04-04: 1000 mL

## 2016-04-04 MED ORDER — ACETAMINOPHEN 325 MG PO TABS: 325.0000 mg | ORAL_TABLET | ORAL | Status: DC | PRN

## 2016-04-04 MED ORDER — CEFAZOLIN SODIUM-DEXTROSE 2-4 GM/100ML-% IV SOLN
2.0000 g | INTRAVENOUS | Status: AC
  Administered 2016-04-04: 2 g via INTRAVENOUS

## 2016-04-04 MED ORDER — PROPOFOL 10 MG/ML IV BOLUS
INTRAVENOUS | Status: DC | PRN
  Administered 2016-04-04: 150 mg via INTRAVENOUS

## 2016-04-04 MED ORDER — ROCURONIUM BROMIDE 100 MG/10ML IV SOLN
INTRAVENOUS | Status: AC
  Filled 2016-04-04: qty 1

## 2016-04-04 MED ORDER — BUPIVACAINE-EPINEPHRINE 0.5% -1:200000 IJ SOLN
INTRAMUSCULAR | Status: AC
  Filled 2016-04-04: qty 1

## 2016-04-04 MED ORDER — ACETAMINOPHEN 160 MG/5ML PO SOLN: 325.0000 mg | ORAL | Status: DC | PRN

## 2016-04-04 MED ORDER — ACETAMINOPHEN 650 MG RE SUPP
650.0000 mg | RECTAL | Status: DC | PRN
  Filled 2016-04-04: qty 1

## 2016-04-04 MED ORDER — DEXAMETHASONE SODIUM PHOSPHATE 10 MG/ML IJ SOLN
INTRAMUSCULAR | Status: DC | PRN
  Administered 2016-04-04: 10 mg via INTRAVENOUS

## 2016-04-04 MED ORDER — SUGAMMADEX SODIUM 200 MG/2ML IV SOLN
INTRAVENOUS | Status: DC | PRN
  Administered 2016-04-04: 200 mg via INTRAVENOUS

## 2016-04-04 MED ORDER — OXYCODONE HCL 5 MG PO TABS
5.0000 mg | ORAL_TABLET | ORAL | Status: DC | PRN
  Administered 2016-04-04 (×2): 5 mg via ORAL
  Filled 2016-04-04 (×2): qty 1

## 2016-04-04 MED ORDER — LACTATED RINGERS IV SOLN
INTRAVENOUS | Status: DC
Start: 2016-04-04 — End: 2016-04-04

## 2016-04-04 MED ORDER — OXYCODONE HCL 5 MG PO TABS: 5.0000 mg | ORAL_TABLET | ORAL | 0 refills | Status: DC | PRN

## 2016-04-04 MED ORDER — FENTANYL CITRATE (PF) 250 MCG/5ML IJ SOLN
INTRAMUSCULAR | Status: AC
  Filled 2016-04-04: qty 5

## 2016-04-04 MED ORDER — ACETAMINOPHEN 10 MG/ML IV SOLN
INTRAVENOUS | Status: AC
  Filled 2016-04-04: qty 100

## 2016-04-04 MED ORDER — BUPIVACAINE-EPINEPHRINE 0.5% -1:200000 IJ SOLN
INTRAMUSCULAR | Status: DC | PRN
  Administered 2016-04-04: 20 mL

## 2016-04-04 MED ORDER — LIDOCAINE HCL (CARDIAC) 20 MG/ML IV SOLN
INTRAVENOUS | Status: DC | PRN
  Administered 2016-04-04: 60 mg via INTRATRACHEAL

## 2016-04-04 MED ORDER — SUGAMMADEX SODIUM 200 MG/2ML IV SOLN
INTRAVENOUS | Status: AC
  Filled 2016-04-04: qty 2

## 2016-04-04 MED ORDER — MIDAZOLAM HCL 5 MG/5ML IJ SOLN
INTRAMUSCULAR | Status: DC | PRN
Start: 2016-04-04 — End: 2016-04-04
  Administered 2016-04-04: 2 mg via INTRAVENOUS

## 2016-04-04 MED ORDER — FENTANYL CITRATE (PF) 250 MCG/5ML IJ SOLN
INTRAMUSCULAR | Status: DC | PRN
  Administered 2016-04-04 (×2): 50 ug via INTRAVENOUS
  Administered 2016-04-04: 25 ug via INTRAVENOUS
  Administered 2016-04-04 (×2): 50 ug via INTRAVENOUS
  Administered 2016-04-04: 25 ug via INTRAVENOUS

## 2016-04-04 MED ORDER — OXYCODONE HCL 5 MG PO TABS: 5.0000 mg | ORAL_TABLET | Freq: Once | ORAL | Status: DC | PRN

## 2016-04-04 MED ORDER — CEFAZOLIN SODIUM-DEXTROSE 2-4 GM/100ML-% IV SOLN
INTRAVENOUS | Status: AC
Start: 2016-04-04 — End: 2016-04-04
  Filled 2016-04-04: qty 100

## 2016-04-04 MED ORDER — SODIUM CHLORIDE 0.9% FLUSH: 3.0000 mL | INTRAVENOUS | Status: DC | PRN

## 2016-04-04 MED ORDER — LACTATED RINGERS IV SOLN
INTRAVENOUS | Status: DC | PRN
  Administered 2016-04-04: 07:00:00 via INTRAVENOUS

## 2016-04-04 MED ORDER — LACTATED RINGERS IR SOLN
Status: DC | PRN
  Administered 2016-04-04: 1000 mL

## 2016-04-04 MED ORDER — MORPHINE SULFATE (PF) 10 MG/ML IV SOLN: 2.0000 mg | INTRAVENOUS | Status: DC | PRN

## 2016-04-04 MED ORDER — HYDROMORPHONE HCL 1 MG/ML IJ SOLN
0.2500 mg | INTRAMUSCULAR | Status: DC | PRN
  Administered 2016-04-04 (×2): 0.5 mg via INTRAVENOUS

## 2016-04-04 MED ORDER — OXYCODONE HCL 5 MG/5ML PO SOLN
5.0000 mg | Freq: Once | ORAL | Status: DC | PRN
  Filled 2016-04-04: qty 5

## 2016-04-04 MED ORDER — HYDROMORPHONE HCL 1 MG/ML IJ SOLN
INTRAMUSCULAR | Status: AC
  Filled 2016-04-04: qty 1

## 2016-04-04 SURGICAL SUPPLY — 58 items
APPLIER CLIP 5 13 M/L LIGAMAX5 (MISCELLANEOUS)
BENZOIN TINCTURE PRP APPL 2/3 (GAUZE/BANDAGES/DRESSINGS) ×4 IMPLANT
BLADE HEX COATED 2.75 (ELECTRODE) IMPLANT
BLADE SURG 15 STRL LF DISP TIS (BLADE) IMPLANT
BLADE SURG 15 STRL SS (BLADE)
BLADE SURG SZ10 CARB STEEL (BLADE) IMPLANT
CABLE HIGH FREQUENCY MONO STRZ (ELECTRODE) ×4 IMPLANT
CHLORAPREP W/TINT 26ML (MISCELLANEOUS) ×4 IMPLANT
CLIP APPLIE 5 13 M/L LIGAMAX5 (MISCELLANEOUS) IMPLANT
COVER SURGICAL LIGHT HANDLE (MISCELLANEOUS) ×4 IMPLANT
DECANTER SPIKE VIAL GLASS SM (MISCELLANEOUS) ×4 IMPLANT
DISSECT BALLN SPACEMKR + OVL (BALLOONS) ×4
DISSECTOR BALLN SPACEMKR + OVL (BALLOONS) ×3 IMPLANT
DISSECTOR BLUNT TIP ENDO 5MM (MISCELLANEOUS) ×4 IMPLANT
DRAIN PENROSE 18X1/2 LTX STRL (DRAIN) IMPLANT
DRAPE INCISE IOBAN 66X45 STRL (DRAPES) IMPLANT
DRAPE LAPAROTOMY TRNSV 102X78 (DRAPE) IMPLANT
DRAPE UTILITY XL STRL (DRAPES) ×4 IMPLANT
DRSG TEGADERM 2-3/8X2-3/4 SM (GAUZE/BANDAGES/DRESSINGS) ×4 IMPLANT
DRSG TEGADERM 4X4.75 (GAUZE/BANDAGES/DRESSINGS) ×4 IMPLANT
DRSG TELFA 3X8 NADH (GAUZE/BANDAGES/DRESSINGS) IMPLANT
ELECT PENCIL ROCKER SW 15FT (MISCELLANEOUS) IMPLANT
ELECT REM PT RETURN 9FT ADLT (ELECTROSURGICAL) ×4
ELECTRODE REM PT RTRN 9FT ADLT (ELECTROSURGICAL) ×3 IMPLANT
GAUZE SPONGE 2X2 8PLY STRL LF (GAUZE/BANDAGES/DRESSINGS) IMPLANT
GAUZE SPONGE 4X4 12PLY STRL (GAUZE/BANDAGES/DRESSINGS) ×4 IMPLANT
GLOVE ECLIPSE 8.0 STRL XLNG CF (GLOVE) ×8 IMPLANT
GLOVE INDICATOR 8.0 STRL GRN (GLOVE) ×12 IMPLANT
GOWN STRL REUS W/TWL LRG LVL3 (GOWN DISPOSABLE) IMPLANT
GOWN STRL REUS W/TWL XL LVL3 (GOWN DISPOSABLE) ×12 IMPLANT
IRRIG SUCT STRYKERFLOW 2 WTIP (MISCELLANEOUS) ×4
IRRIGATION SUCT STRKRFLW 2 WTP (MISCELLANEOUS) ×3 IMPLANT
KIT BASIN OR (CUSTOM PROCEDURE TRAY) ×8 IMPLANT
MESH HERNIA 6X6 BARD (Mesh General) ×6 IMPLANT
MESH HERNIA BARD 6X6 (Mesh General) ×2 IMPLANT
NEEDLE HYPO 25X1 1.5 SAFETY (NEEDLE) IMPLANT
NEEDLE INSUFFLATION 14GA 120MM (NEEDLE) ×4 IMPLANT
NS IRRIG 1000ML POUR BTL (IV SOLUTION) ×4 IMPLANT
PACK BASIC VI WITH GOWN DISP (CUSTOM PROCEDURE TRAY) IMPLANT
PUMP ON Q 100MLX2ML/HR (PAIN MANAGEMENT) IMPLANT
SCISSORS LAP 5X35 DISP (ENDOMECHANICALS) ×4 IMPLANT
SPONGE GAUZE 2X2 STER 10/PKG (GAUZE/BANDAGES/DRESSINGS)
SPONGE LAP 4X18 X RAY DECT (DISPOSABLE) IMPLANT
STRIP CLOSURE SKIN 1/2X4 (GAUZE/BANDAGES/DRESSINGS) IMPLANT
SUT MNCRL AB 4-0 PS2 18 (SUTURE) ×4 IMPLANT
SUT PROLENE 2 0 CT2 30 (SUTURE) IMPLANT
SUT VIC AB 2-0 SH 18 (SUTURE) IMPLANT
SUT VIC AB 3-0 SH 27 (SUTURE)
SUT VIC AB 3-0 SH 27XBRD (SUTURE) IMPLANT
SYR BULB IRRIGATION 50ML (SYRINGE) IMPLANT
SYR CONTROL 10ML LL (SYRINGE) IMPLANT
TACKER 5MM HERNIA 3.5CML NAB (ENDOMECHANICALS) ×4 IMPLANT
TOWEL OR 17X26 10 PK STRL BLUE (TOWEL DISPOSABLE) ×4 IMPLANT
TOWEL OR NON WOVEN STRL DISP B (DISPOSABLE) ×4 IMPLANT
TRAY LAPAROSCOPIC (CUSTOM PROCEDURE TRAY) ×4 IMPLANT
TROCAR CANNULA W/PORT DUAL 5MM (MISCELLANEOUS) ×4 IMPLANT
TUBING INSUF HEATED (TUBING) ×4 IMPLANT
YANKAUER SUCT BULB TIP 10FT TU (MISCELLANEOUS) IMPLANT

## 2016-04-04 NOTE — Discharge Instructions (Addendum)
[facility] _______[facility]   INGUINAL HERNIA REPAIR: POST OP INSTRUCTIONS  Always review your discharge instruction sheet given to you by the facility where your surgery was performed. IF YOU HAVE DISABILITY OR FAMILY LEAVE FORMS, YOU MUST BRING THEM TO THE OFFICE FOR PROCESSING.   DO NOT GIVE THEM TO YOUR DOCTOR.  1. A  prescription for pain medication may be given to you upon discharge.  Take your pain medication as prescribed, if needed.  If narcotic pain medicine is not needed, then you may take acetaminophen (Tylenol) or ibuprofen (Advil) as needed. 2. Take your usually prescribed medications unless otherwise directed. 3. If you need a refill on your pain medication, please contact your pharmacy.  They will contact our office to request authorization. Prescriptions will not be filled after 5 pm or on week-ends. 4. You should follow a light diet the first 24 hours after arrival home, such as soup and crackers, etc.  Be sure to include lots of fluids daily.  Resume your normal diet the day after surgery. 5. Most patients will experience some swelling and bruising around the umbilicus or in the groin and scrotum.  Ice packs and reclining will help.  Swelling and bruising can take several days to resolve.  6. It is common to experience some constipation if taking pain medication after surgery.  Increasing fluid intake and taking a stool softener (such as Colace) will usually help or prevent this problem from occurring.  A mild laxative (Milk of Magnesia or Miralax) should be taken according to package directions if there are no bowel movements after 48 hours. 7. Unless discharge instructions indicate otherwise, you may remove your bandages 72 hours after surgery, and you may shower at that time.  You may have steri-strips (small skin tapes) in place directly over the incision.  These strips should be left on the skin.  If your surgeon used skin glue on the incision, you may shower in 24  hours.  The glue will flake off over the next 2-3 weeks.  Any sutures or staples will be removed at the office during your follow-up visit. 8. ACTIVITIES:  You may resume regular (light) daily activities beginning the next day--such as daily self-care, walking, climbing stairs--gradually increasing activities as tolerated.  You may have sexual intercourse when it is comfortable.  Refrain from any heavy lifting or straining-nothing over 10 pounds for 6 weeks.  a. You may drive when you are no longer taking prescription pain medication, you can comfortably wear a seatbelt, and you can safely maneuver your car and apply brakes. b. RETURN TO WORK:  _Desk type work in 1-2 weeks, full duty in 6 weeks if pain-free._________________________________________________________ 9. You should see your doctor in the office for a follow-up appointment approximately 2-3 weeks after your surgery.  Make sure that you call for this appointment within a day or two after you arrive home to insure a convenient appointment time. 10. OTHER INSTRUCTIONS:  __________________________________________________________________________________________________________________________________________________________________________________________  WHEN TO CALL YOUR DOCTOR: 1. Fever over 101.0 2. Inability to urinate 3. Nausea and/or vomiting 4. Extreme swelling or bruising 5. Continued bleeding from incision. 6. Increased pain, redness, or drainage from the incision  The clinic staff is available to answer your questions during regular business hours.  Please dont hesitate to call and ask to speak to one of the nurses for clinical concerns.  If you have a medical emergency, go to the nearest emergency room or call 911.  A Careers adviser from [facility] is always on call at the hospital   [address], [place], [place]  [address] ?  [address] [address], [place], [place]   [address] [phone] ? [phone] ? FAX  [phone] Web site: [email or web address]   General Anesthesia, Adult, Care After Refer to this sheet in the next few weeks. These instructions provide you with information on caring for yourself after your procedure. Your health care provider may also give you more specific instructions. Your treatment has been planned according to current medical practices, but problems sometimes occur. Call your health care provider if you have any problems or questions after your procedure. WHAT TO EXPECT AFTER THE PROCEDURE After the procedure, it is typical to experience:  Sleepiness.  Nausea and vomiting. HOME CARE INSTRUCTIONS  For the first 24 hours after general anesthesia:  Have a responsible person with you.  Do not drive a car. If you are alone, do not take public transportation.  Do not drink alcohol.  Do not take medicine that has not been prescribed by your health care provider.  Do not sign important papers or make important decisions.  You may resume a normal diet and activities as directed by your health care provider.  Change bandages (dressings) as directed.  If you have questions or problems that seem related to general anesthesia, call the hospital and ask for the anesthetist or anesthesiologist on call. SEEK MEDICAL CARE IF:  You have nausea and vomiting that continue the day after anesthesia.  You develop a rash. SEEK IMMEDIATE MEDICAL CARE IF:   You have difficulty breathing.  You have chest pain.  You have any allergic problems.   This information is not intended to replace advice given to you by your health care provider. Make sure you discuss any questions you have with your health care provider.   Document Released: 12/02/2000 Document Revised: 09/16/2014 Document Reviewed: 12/25/2011 Elsevier Interactive Patient Education [employer]

## 2016-04-04 NOTE — Interval H&P Note (Signed)
History and Physical Interval Note:  04/04/2016 7:30 AM  Alexander Nelson  has presented today for surgery, with the diagnosis of Bilateral inguinal hernias  The various methods of treatment have been discussed with the patient and family. After consideration of risks, benefits and other options for treatment, the patient has consented to  Procedure(s): INSERTION OF MESH (Bilateral) LAPAROSCOPIC BILATERAL INGUINAL HERNIA REPAIR (Bilateral) as a surgical intervention .  The patient's history has been reviewed, patient examined, no change in status, stable for surgery.  I have reviewed the patient's chart and labs.  Questions were answered to the patient's satisfaction.     Makenzye Troutman Shela Commons

## 2016-04-04 NOTE — Anesthesia Preprocedure Evaluation (Addendum)
Anesthesia Evaluation  Patient identified by MRN, date of birth, ID band Patient awake    Reviewed: Allergy & Precautions, NPO status , Patient's Chart, lab work & pertinent test results  History of Anesthesia Complications Negative for: history of anesthetic complications  Airway Mallampati: I  TM Distance: >3 FB Neck ROM: Full    Dental  (+) Teeth Intact   Pulmonary neg pulmonary ROS,    breath sounds clear to auscultation       Cardiovascular negative cardio ROS   Rhythm:Regular     Neuro/Psych negative neurological ROS     GI/Hepatic negative GI ROS, Neg liver ROS,   Endo/Other    Renal/GU negative Renal ROS     Musculoskeletal negative musculoskeletal ROS (+)   Abdominal   Peds  Hematology negative hematology ROS (+)   Anesthesia Other Findings B/l inguinal hernias, sarcoidosis  Reproductive/Obstetrics                             Anesthesia Physical Anesthesia Plan  ASA: II  Anesthesia Plan: General   Post-op Pain Management:    Induction: Intravenous  Airway Management Planned: LMA and Oral ETT  Additional Equipment: None  Intra-op Plan:   Post-operative Plan: Extubation in OR  Informed Consent: I have reviewed the patients History and Physical, chart, labs and discussed the procedure including the risks, benefits and alternatives for the proposed anesthesia with the patient or authorized representative who has indicated his/her understanding and acceptance.   Dental advisory given  Plan Discussed with: CRNA and Surgeon  Anesthesia Plan Comments:        Anesthesia Quick Evaluation

## 2016-04-04 NOTE — H&P (View-Only) (Signed)
Alexander Nelson 03/07/2016 2:06 PM Location: [facility] Patient #: [id number] DOB: June 05, 1966 Divorced / Language: [place] / Race: White Male   History of Present Illness Alexander Pollack MD; 03/07/2016 2:34 PM) Patient words: hernia.  The patient is a 50 year old male.  Note:He is self referred and comes in for evaluation of bilateral inguinal hernias. He had these diagnosed in the past but they were small and asymptomatic. Recently, the left-sided inguinal hernia is becoming larger and uncomfortable/symptomatic. No difficulty with urination. No chronic constipation. 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 He has bilateral cataracts. He is on prednisone 10 mg a day. He is going to see his ophthalmologist tomorrow regarding cataract surgery. He has good exercise tolerance and runs for exercise.  Other Problems (Ammie Eversole, LPN; 9/83/3825 0:53 PM) Other disease, cancer, significant illness  Past Surgical History (Ammie Eversole, LPN; 9/76/7341 9:37 PM) Tonsillectomy Vasectomy  Diagnostic Studies History (Ammie Eversole, LPN; 05/11/4096 3:53 PM) Colonoscopy never  Allergies (Ammie Eversole, LPN; 2/99/2426 8:34 PM) No Known Drug Allergies06/29/2017  Medication History (Ammie Eversole, LPN; 1/96/2229 7:98 PM) Folic Acid (1MG  Tablet, Oral) Active. Methotrexate (2.5MG  Tablet, Oral) Active. PredniSONE (5MG  Tablet, Oral) Active. Vitamin D (1000UNIT Tablet, Oral) Active. Medications Reconciled  Social History (Deon Pilling, LPN; 05/30/1940 7:40 PM) Alcohol use Occasional alcohol use. Caffeine use Coffee, Tea. Illicit drug use Remotely quit drug use. Tobacco use Never smoker.  Family History Deon Pilling, LPN; 04/22/4817 5:63 PM) Diabetes Mellitus Mother. Heart Disease Father. Kidney Disease Brother. Migraine Headache Daughter.    Review of Systems (Ammie Eversole LPN; 1/49/7026 3:78 PM) General Not  Present- Appetite Loss, Chills, Fatigue, Fever, Night Sweats, Weight Gain and Weight Loss. Skin Not Present- Change in Wart/Mole, Dryness, Hives, Jaundice, New Lesions, Non-Healing Wounds, Rash and Ulcer. HEENT Not Present- Earache, Hearing Loss, Hoarseness, Nose Bleed, Oral Ulcers, Ringing in the Ears, Seasonal Allergies, Sinus Pain, Sore Throat, Visual Disturbances, Wears glasses/contact lenses and Yellow Eyes. Respiratory Not Present- Bloody sputum, Chronic Cough, Difficulty Breathing, Snoring and Wheezing. Breast Not Present- Breast Mass, Breast Pain, Nipple Discharge and Skin Changes. Gastrointestinal Not Present- Abdominal Pain, Bloating, Bloody Stool, Change in Bowel Habits, Chronic diarrhea, Constipation, Difficulty Swallowing, Excessive gas, Gets full quickly at meals, Hemorrhoids, Indigestion, Nausea, Rectal Pain and Vomiting. Male Genitourinary Not Present- Blood in Urine, Change in Urinary Stream, Frequency, Impotence, Nocturia, Painful Urination, Urgency and Urine Leakage. Endocrine Not Present- Cold Intolerance, Excessive Hunger, Hair Changes, Heat Intolerance, Hot flashes and New Diabetes. Hematology Not Present- Blood Thinners, Easy Bruising, Excessive bleeding, Gland problems, HIV and Persistent Infections.  Vitals (Ammie Eversole LPN; 5/88/5027 7:41 PM) 03/07/2016 2:07 PM Weight: 184.4 lb Height: 73in Body Surface Area: 2.08 m Body Mass Index: 24.33 kg/m  Temp.: 97.8F(Oral)  Pulse: 64 (Regular)  BP: 102/62 (Sitting, Left Arm, Standard)       Physical Exam Alexander Pollack MD; 03/07/2016 2:36 PM) The physical exam findings are as follows: Note:General: WDWN in NAD. Pleasant and cooperative.  HEENT: [place]/AT, no external nasal or ear masses, mucous membranes are moist  EYES: EOMI, pupils normal  CV: RRR, no murmur, no edema  CHEST: Breath sounds equal and clear. Respirations nonlabored.  ABDOMEN: Soft, no umbilical hernias.  GU: Small, reducible  right inguinal bulge. Moderate sized, reducible left inguinal bulge  SKIN: No jaundice.  NEUROLOGIC: Alert and oriented, answers questions appropriately, normal gait and station.  PSYCHIATRIC: Normal mood, affect , and behavior.    Assessment &  Plan Alexander Pollack MD; 03/07/2016 2:38 PM) NON-RECURRENT BILATERAL INGUINAL HERNIA WITHOUT OBSTRUCTION OR GANGRENE (K40.20) Impression: Left side is getting larger and is symptomatic. Also has cataracts and is seeing his ophthalmologist tomorrow to talk about cataract surgery. He is interested in hernia repair. We discussed the small chance of incarceration and what to do about that.  Plan: We talked about doing a laparoscopic bilateral inguinal hernia with mesh. I have explained the procedure, risks, and aftercare of inguinal hernia repair. Risks include but are not limited to bleeding, infection, wound problems, anesthesia, recurrence, bladder or intestine injury, urinary retention, testicular dysfunction, chronic pain, mesh problems.  As for the timing of the surgery, his Ophthalmologist has decreased his Prednisone to 5 mg and stated he can have the hernia surgery prior to his cataract surgery.  Avel Peace, MD

## 2016-04-04 NOTE — Transfer of Care (Signed)
Immediate Anesthesia Transfer of Care Note  Patient: Alexander Nelson  Procedure(s) Performed: Procedure(s): INSERTION OF MESH (Bilateral) LAPAROSCOPIC BILATERAL INGUINAL HERNIA REPAIR (Bilateral)  Patient Location: PACU  Anesthesia Type:General  Level of Consciousness: awake, alert  and patient cooperative  Airway & Oxygen Therapy: Patient Spontanous Breathing and Patient connected to face mask oxygen  Post-op Assessment: Report given to RN and Post -op Vital signs reviewed and stable  Post vital signs: Reviewed and stable  Last Vitals:  Vitals:   04/04/16 0550  BP: (!) 98/53  Pulse: (!) 53  Resp: 16  Temp: 36.6 C    Last Pain:  Vitals:   04/04/16 0550  TempSrc: Oral         Complications: No apparent anesthesia complications

## 2016-04-04 NOTE — Anesthesia Postprocedure Evaluation (Signed)
Anesthesia Post Note  Patient: Alexander Nelson  Procedure(s) Performed: Procedure(s) (LRB): INSERTION OF MESH (Bilateral) LAPAROSCOPIC BILATERAL INGUINAL HERNIA REPAIR (Bilateral)  Patient location during evaluation: PACU Anesthesia Type: General Level of consciousness: awake Pain management: pain level controlled Vital Signs Assessment: post-procedure vital signs reviewed and stable Respiratory status: spontaneous breathing Cardiovascular status: stable Postop Assessment: no signs of nausea or vomiting Anesthetic complications: no    Last Vitals:  Vitals:   04/04/16 1225 04/04/16 1340  BP: 113/61 108/60  Pulse: (!) 55 (!) 51  Resp:  18  Temp:  36.4 C    Last Pain:  Vitals:   04/04/16 1340  TempSrc:   PainSc: 5                  Nathaniel Yaden

## 2016-04-04 NOTE — Anesthesia Procedure Notes (Signed)
Procedure Name: Intubation Date/Time: 04/04/2016 7:56 AM Performed by: Delphia Grates Pre-anesthesia Checklist: Emergency Drugs available, Suction available, Patient being monitored and Patient identified Patient Re-evaluated:Patient Re-evaluated prior to inductionOxygen Delivery Method: Circle system utilized Preoxygenation: Pre-oxygenation with 100% oxygen Intubation Type: IV induction Ventilation: Mask ventilation without difficulty Laryngoscope Size: Mac and 4 Grade View: Grade II Tube type: Oral Tube size: 7.5 mm Number of attempts: 1 Airway Equipment and Method: Stylet Placement Confirmation: ETT inserted through vocal cords under direct vision,  positive ETCO2 and breath sounds checked- equal and bilateral Secured at: 22 cm Tube secured with: Tape Dental Injury: Teeth and Oropharynx as per pre-operative assessment

## 2016-04-04 NOTE — Op Note (Signed)
Operative Note  Alexander Nelson male 50 y.o. 04/04/2016  PREOPERATIVE DX:  Bilateral inguinal hernias  POSTOPERATIVE DX:  Same  PROCEDURE:   Laparoscopic bilateral inguinal hernia repair with mesh.         Surgeon: [name]   Assistants: None  Anesthesia: General endotracheal anesthesia  Indications:   This is a 50 year old male with bilateral inguinal hernias. Left side is symptomatic. He now presents for repair. The procedure, risks, and after care were explained with him preoperatively.    Procedure Detail:  He was seen in the holding area. He voided. He was brought to the operating room placed supine on the operating table and a general anesthetic was given. The hair in the abdominal wall in the groin areas was clipped and these areas were then sterilely prepped and draped. A timeout was performed.  Marcaine was infiltrated in the subcutaneous umbilical subcutaneous tissues. A transverse subumbilical incision was made through the skin and subcutaneous tissue. Using blunt dissection, the left anterior rectus sheath was identified and a small incision made in it. The underlying left rectus muscle was swept laterally exposing the posterior rectus sheath. The balloon dissection device was then placed in the extraperitoneal space. Under laparoscopic vision, balloon dissection was performed of the inferior extraperitoneal space. The balloon was removed and CO2 gas was insufflated.  Two 5 mm trocars were placed in the lower midline. Using blunt dissection identified the symphysis pubis and Cooper's ligament bilaterally and swept tissue free from these areas. Beginning on the left side, I separated tissue from the right anterior and lateral abdominal walls. I isolated spermatic cord and indirect hernia was noted. The sac and extraperitoneal fat going up into the hernia were reduced and dissected superiorly.  Next,  The right side was approached. I dissected the tissue from the  anterior and lateral abdominal walls. I exposed the spermatic cord and isolated. An indirect hernia sac and hernia contents were then reduced out of an indirect hernia and dissected superiorly.  A piece of 6" x 6 and polypropylene mesh was brought into the field. 2 cm was cut off of it and a partial longitudinal slit was cut into it. It was then placed into the right extraperitoneal space and deployed so that the 2 tails of the mesh were wrapped around the spermatic cord. It was then anchored to Cooper's ligament, the anterior abdominal wall, and lateral abdominal wall with spiral tacks. This provided for adequate coverage and good overlap of the direct, indirect, and femoral spaces.  A second piece of 6" x 6 and polypropylene mesh was brought into the field. 2 cm was cut off of it and a partial longitudinal slit was cut into it. It was then placed into the left extraperitoneal space and deployed so that the 2 tails of the mesh were wrapped around the spermatic cord. It was then anchored to Cooper's ligament, the anterior abdominal wall, and lateral abdominal wall with spiral tacks. This provided for adequate coverage and good overlap of the direct, indirect, and femoral spaces.  A pneumoperitoneum was noted but I did not see any tear in the peritoneum. I decompressed this by inserting a Veress needle in the lateral aspect of the subumbilical incision. Following this, I inspected the extraperitoneal area and there is no evidence of bleeding. The CO2 gas was released and I watched extraperitoneal contents approximate the mesh. There Veress needle was removed. The trocars were removed.  The left anterior rectus sheath defect was closed with interrupted 0  Vicryl sutures. Skin incisions were closed with 4-0 Monocryl subcuticular stitches. Steri-Strips and sterile dressings were applied.  He tolerated the procedure well without any apparent complications and was taken to the recovery room in satisfactory  condition. Estimated blood loss less than 150 cc.        Complications:  * No complications entered in OR log *         Disposition: PACU - hemodynamically stable.         Condition: stable

## 2016-06-03 ENCOUNTER — Encounter: Payer: Self-pay | Admitting: Pulmonary Disease

## 2016-06-03 ENCOUNTER — Ambulatory Visit (INDEPENDENT_AMBULATORY_CARE_PROVIDER_SITE_OTHER): Payer: BLUE CROSS/BLUE SHIELD | Admitting: Pulmonary Disease

## 2016-06-03 VITALS — BP 104/58 | HR 56 | Ht 73.0 in | Wt 180.4 lb

## 2016-06-03 DIAGNOSIS — Z23 Encounter for immunization: Secondary | ICD-10-CM

## 2016-06-03 DIAGNOSIS — D869 Sarcoidosis, unspecified: Secondary | ICD-10-CM

## 2016-06-03 NOTE — Patient Instructions (Signed)
We will arrange for a lung function test this week and again for one in a year when you come back to see me We'll call you with the results of the lung function test Keep taking your sarcoid medications as directed by a rheumatologist

## 2016-06-03 NOTE — Progress Notes (Signed)
   Subjective:    Patient ID: Alexander Nelson, male    DOB: 1965/10/26, 50 y.o.   MRN: [id number]  Synopsis: Former patient of Dr. Shelle Ironlance who has a constellation of symptoms that seem consistent with sarcoidosis. Dr. Shelle Ironlance summarized his clinical situation as follows: CXR 2016:  Mediastinal LN, no obvious infiltrate. 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 Follow-up    Pt reports breathing is fine and has no complaints.    [name] has been doing well since the last visit. He has no respiratory complaints. He started working out a week ago and he's not been feeling short of breath with that. His visual changes have improved significantly. He had cataract surgery. He has a few floaters every now and then, but for the most part his vision has been stable. He continues to take subcutaneous methotrexate and prednisone.    Past Medical History:  Diagnosis Date  . Abnormal chest x-ray 3 weeks ago  . Sarcoidosis (HCC)       Review of Systems     Objective:   Physical Exam  Vitals:   06/03/16 1137  BP: (!) 104/58  Pulse: (!) 56  SpO2: 99%  Weight: 180 lb 6.4 oz (81.8 kg)  Height: 6\' 1"  (1.854 m)  RA  Gen: well appearing HENT: OP clear, TM's clear, neck supple PULM: CTA B, normal percussion CV: RRR, no mgr, trace edema GI: BS+, soft, nontender Derm: no cyanosis or rash Psyche: normal mood and affect   March 2017 pulmonary function testing ratio 83%, FEV1 4.60 L (100% predicted, 7% change with bronchodilator), FVC 5.55 L (94% predicted), total lung capacity 7.44 L (94% predicted), DLCO 32.12 (83% predicted).       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 Fortunately this seems to have improved significantly after his most recent  cataract surgery.  We will check a lung function test to make sure there is no evidence of any sort of progression from his known sarcoidosis, but I suspect these will be normal.  I recommend that he continue with subcutaneous methotrexate and prednisone as directed by rheumatology for primarily ocular sarcoidosis.  Follow-up in one year with a pulmonary function test.    Current Outpatient Prescriptions:  .  Cholecalciferol (VITAMIN D3) 5000 units TABS, Take 1 tablet by mouth daily., Disp: , Rfl:  .  EPINEPHrine 0.3 mg/0.3 mL IJ SOAJ injection, Inject into the muscle as needed., Disp: , Rfl: 2 .  folic acid (FOLVITE) 1 MG tablet, Take 2 tablets by mouth daily., Disp: , Rfl: 4 .  Methotrexate, PF, 25 MG/0.5ML SOAJ, Inject 25 mg into the skin once a week., Disp: , Rfl:  .  predniSONE (DELTASONE) 5 MG tablet, Take 5 mg by mouth daily with breakfast. Take 4.5 tablets daily, Disp: , Rfl:  .  traZODone (DESYREL) 50 MG tablet, Take 50 mg by mouth at bedtime as needed for sleep., Disp: , Rfl:

## 2016-06-03 NOTE — Assessment & Plan Note (Signed)
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 Fortunately this seems to have improved significantly after his most recent cataract surgery.  We will check a lung function test to make sure there is no evidence of any sort of progression from his known sarcoidosis, but I suspect these will be normal.  I recommend that he continue with subcutaneous methotrexate and prednisone as directed by rheumatology for primarily ocular sarcoidosis.  Follow-up in one year with a pulmonary function test.

## 2016-06-07 ENCOUNTER — Ambulatory Visit (HOSPITAL_COMMUNITY)
Admission: RE | Admit: 2016-06-07 | Discharge: 2016-06-07 | Disposition: A | Discharge status: Home / Self Care | Payer: BLUE CROSS/BLUE SHIELD | Source: Ambulatory Visit | Attending: Pulmonary Disease | Admitting: Pulmonary Disease

## 2016-06-07 DIAGNOSIS — D869 Sarcoidosis, unspecified: Secondary | ICD-10-CM | POA: Diagnosis present

## 2016-06-07 LAB — PULMONARY FUNCTION TEST
DL/VA % pred: 83 %
DL/VA: 4.09 ml/min/mmHg/L
DLCO unc % pred: 83 %
DLCO unc: 32.22 ml/min/mmHg
FEF 25-75 Post: 4.72 L/sec
FEF 25-75 Pre: 3.69 L/sec
FEF2575-%Change-Post: 28 %
FEF2575-%Pred-Post: 119 %
FEF2575-%Pred-Pre: 93 %
FEV1-%Change-Post: 6 %
FEV1-%Pred-Post: 104 %
FEV1-%Pred-Pre: 98 %
FEV1-Post: 4.76 L
FEV1-Pre: 4.47 L
FEV1FVC-%Change-Post: 4 %
FEV1FVC-%Pred-Pre: 98 %
FEV6-%Change-Post: 2 %
FEV6-%Pred-Post: 104 %
FEV6-%Pred-Pre: 101 %
FEV6-Post: 5.93 L
FEV6-Pre: 5.77 L
FEV6FVC-%Change-Post: 0 %
FEV6FVC-%Pred-Post: 102 %
FEV6FVC-%Pred-Pre: 101 %
FVC-%Change-Post: 2 %
FVC-%Pred-Post: 101 %
FVC-%Pred-Pre: 99 %
FVC-Post: 5.97 L
FVC-Pre: 5.84 L
Post FEV1/FVC ratio: 80 %
Post FEV6/FVC ratio: 99 %
Pre FEV1/FVC ratio: 76 %
Pre FEV6/FVC Ratio: 99 %
RV % pred: 102 %
RV: 2.34 L
TLC % pred: 102 %
TLC: 8.09 L

## 2016-06-07 MED ORDER — ALBUTEROL SULFATE (2.5 MG/3ML) 0.083% IN NEBU
2.5000 mg | INHALATION_SOLUTION | Freq: Once | RESPIRATORY_TRACT | Status: AC
  Administered 2016-06-07: 2.5 mg via RESPIRATORY_TRACT

## 2016-07-23 IMAGING — CT CT CHEST W/ CM
2 of 4 series · 15 of 36 positions shown, 18 images · IV contrast (Omnipaque 300)
Comparison: None.

CLINICAL DATA: Evaluate abnormal chest x-ray.

EXAM:
CT CHEST WITH CONTRAST
TECHNIQUE: Multidetector CT imaging of the chest was performed during
intravenous contrast administration.
CONTRAST:  80mL OMNIPAQUE IOHEXOL 300 MG/ML  SOLN

[Series 2: chest routine with · axial · 0.75mm/px · z∈[-325,-50]mm · 12 of 67 slices shown, 15 images]
[im 6/67  mediastinal]
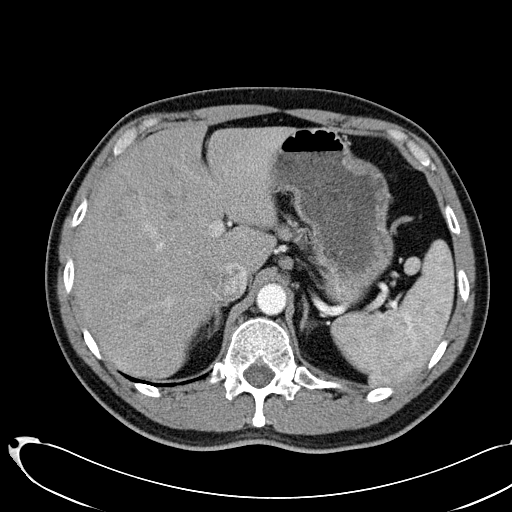
[im 6/67  lung]
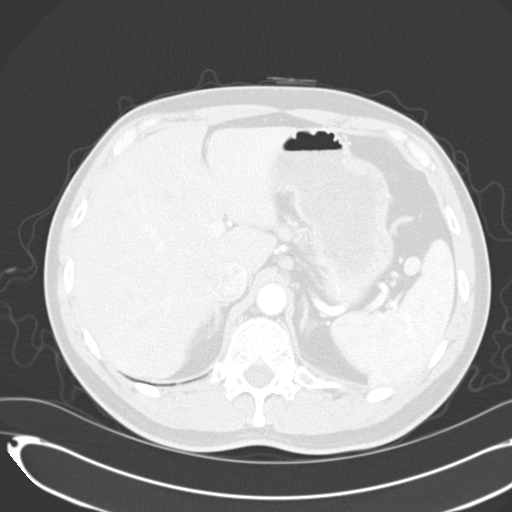
[im 11/67  lung]
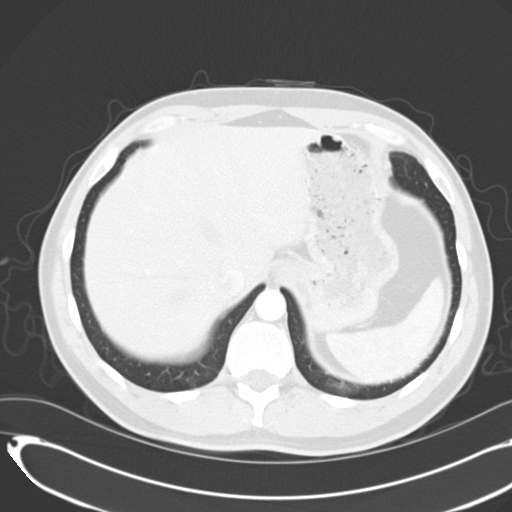
[im 16/67  lung]
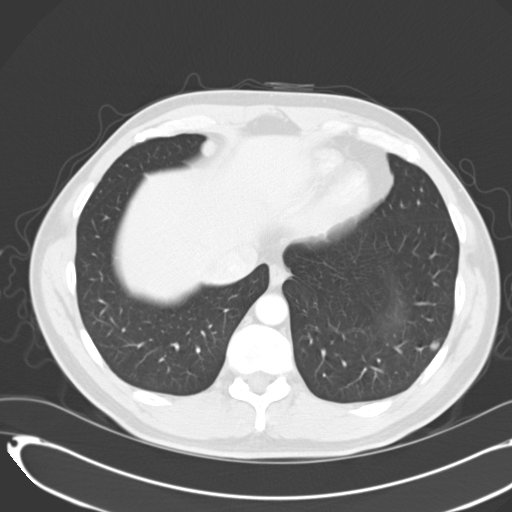
[im 21/67  lung]
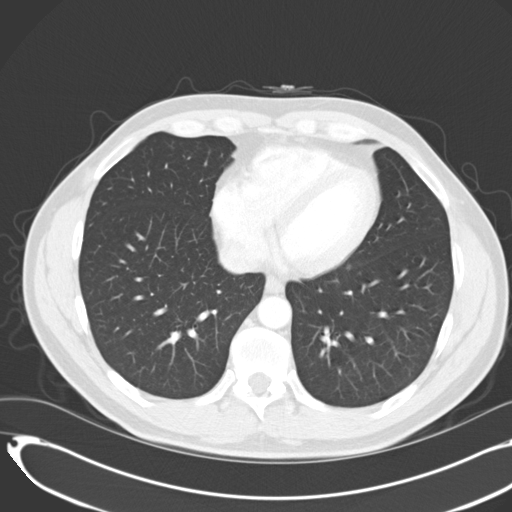
[im 26/67  mediastinal]
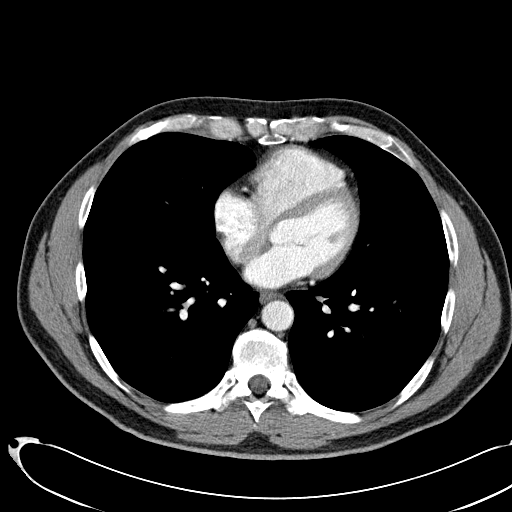
[im 26/67  lung]
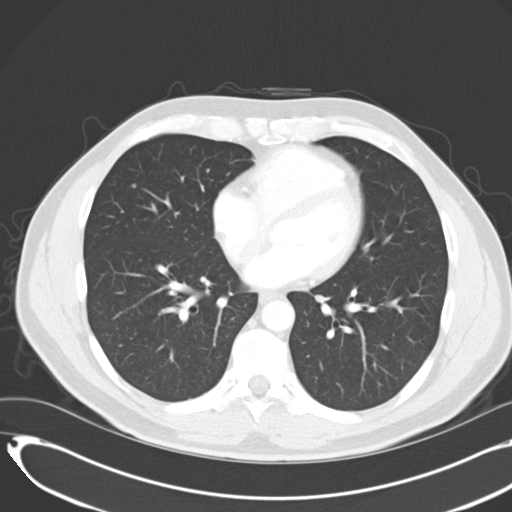
[im 31/67  lung]
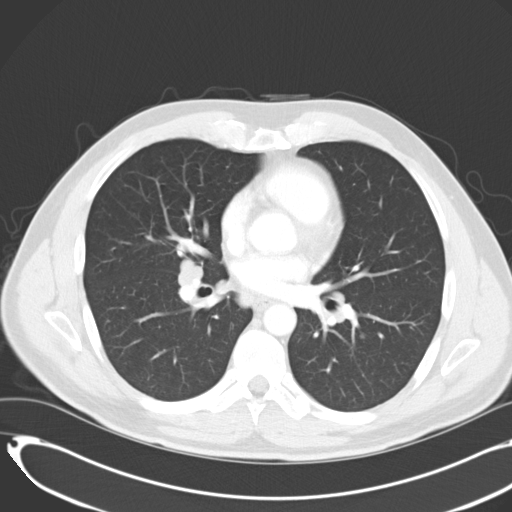
[im 36/67  lung]
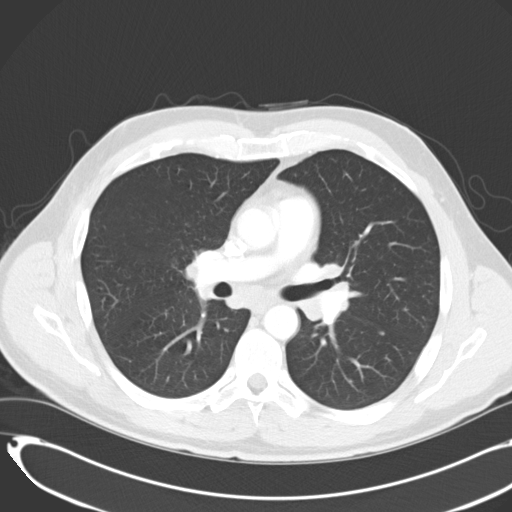
[im 41/67  lung]
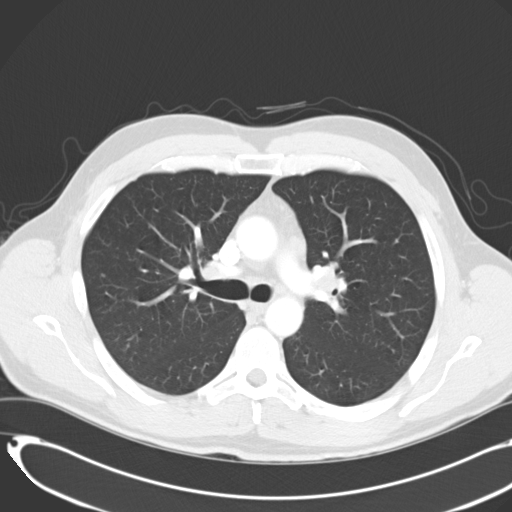
[im 46/67  mediastinal]
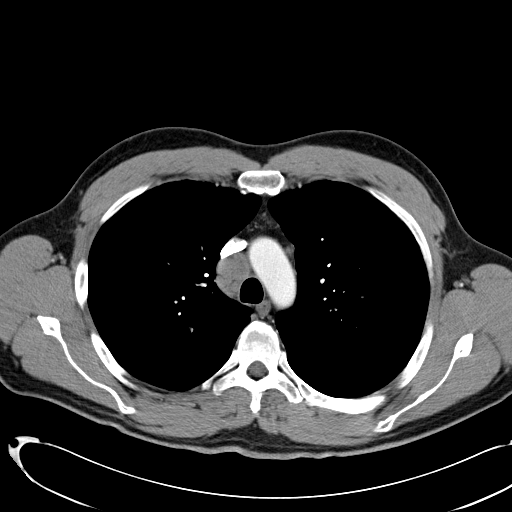
[im 46/67  lung]
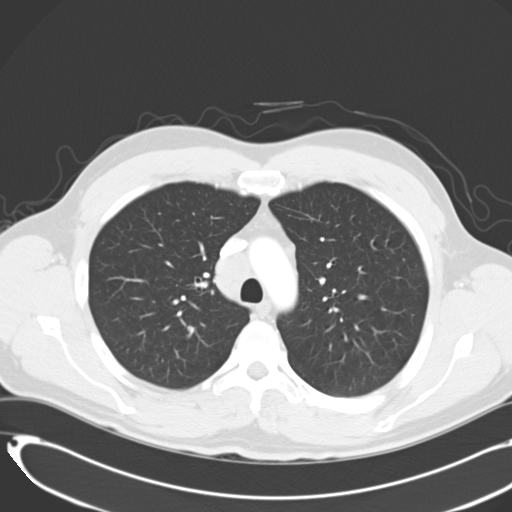
[im 51/67  lung]
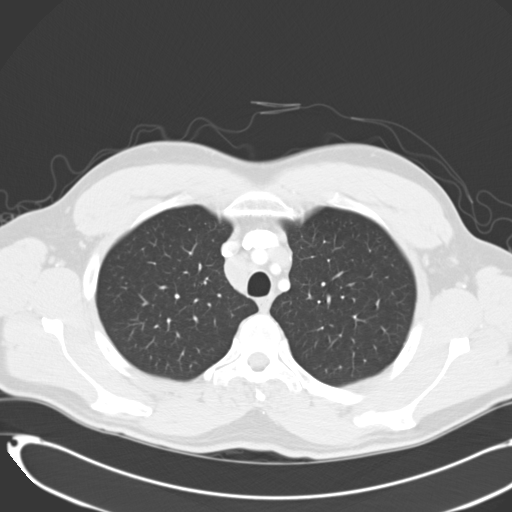
[im 56/67  lung]
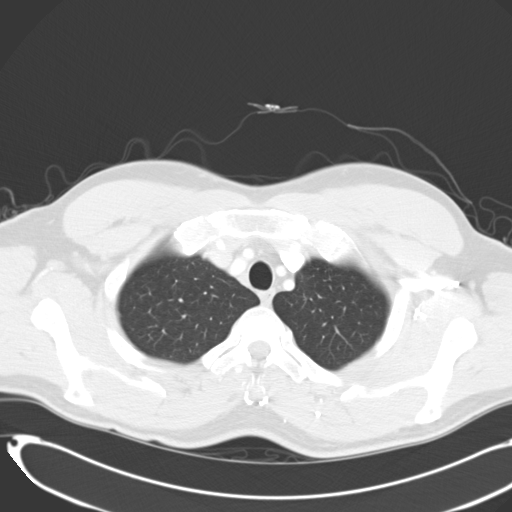
[im 61/67  lung]
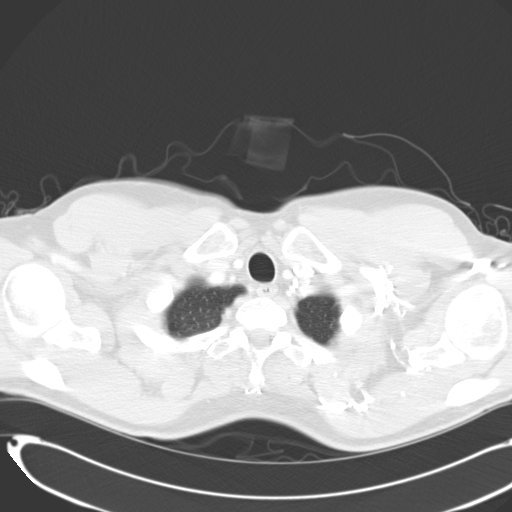

[Series 602: cor · coronal · 0.75mm/px · 3 of 122 slices shown]
[im 25/122  lung]
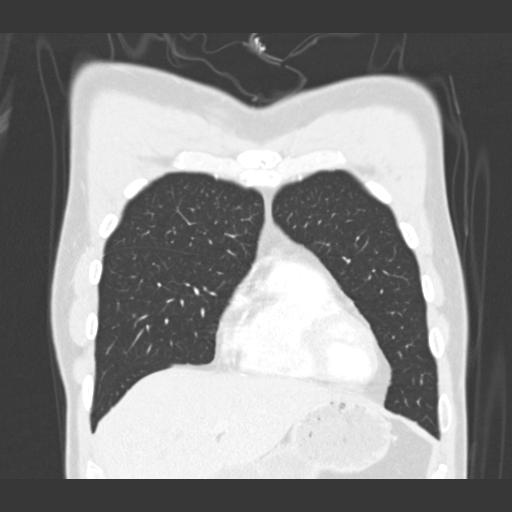
[im 49/122  lung]
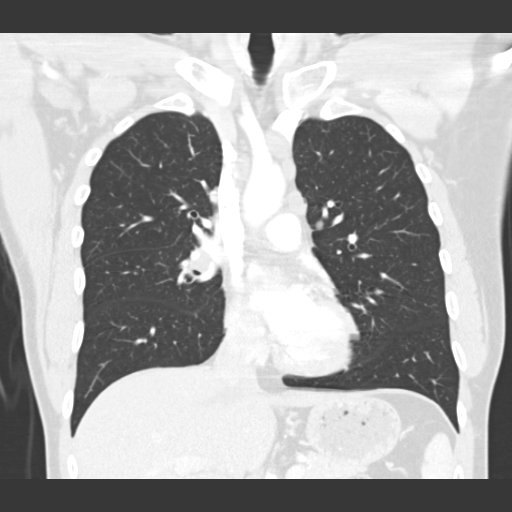
[im 73/122  lung]
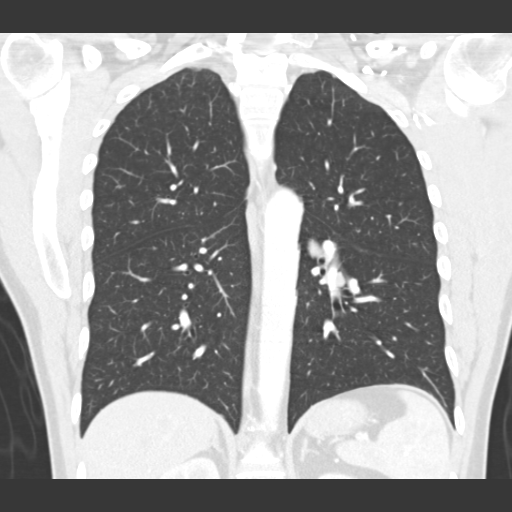

[15 of 36 positions shown; findings below may reference images not displayed]

FINDINGS: Chest wall: No chest wall mass, supraclavicular or axillary
lymphadenopathy. There are small scattered supraclavicular lymph
nodes bilaterally. The thyroid gland is normal. The bony thorax is
intact. No bone lesions or spinal canal compromise.

Mediastinum:  Bulky mediastinal and hilar lymphadenopathy.

Index nodal mass in the right paratracheal region on image 22
measures 32 x 25 mm.

Subcarinal adenopathy measures 21 x 17 mm.

Left infrahilar node on image 35 measures 15 x 14 mm.

Right hilar node on image number 29 measures 25 x 20 mm.

The heart is normal in size. The aorta is normal. The esophagus is
grossly normal.

Lungs/pleura: Numerous upper lobe predominant small pulmonary
nodules are noted many of these are peribronchovascular and
subpleural. No interstitial thickening or bronchiectasis. No focal
airspace consolidation or pleural effusion.

Upper abdomen:  No significant findings.
IMPRESSION: Mediastinal and hilar lymphadenopathy and scattered airspace nodules
most consistent with sarcoidosis.

## 2016-07-25 ENCOUNTER — Encounter: Payer: Self-pay | Admitting: Rheumatology

## 2016-07-25 ENCOUNTER — Ambulatory Visit (INDEPENDENT_AMBULATORY_CARE_PROVIDER_SITE_OTHER): Payer: BLUE CROSS/BLUE SHIELD | Admitting: Rheumatology

## 2016-07-25 VITALS — BP 90/60 | HR 66 | Resp 14 | Ht 73.0 in | Wt 185.0 lb

## 2016-07-25 DIAGNOSIS — E559 Vitamin D deficiency, unspecified: Secondary | ICD-10-CM

## 2016-07-25 DIAGNOSIS — D869 Sarcoidosis, unspecified: Secondary | ICD-10-CM

## 2016-07-25 DIAGNOSIS — M25561 Pain in right knee: Secondary | ICD-10-CM | POA: Diagnosis not present

## 2016-07-25 DIAGNOSIS — Z79899 Other long term (current) drug therapy: Secondary | ICD-10-CM | POA: Diagnosis not present

## 2016-07-25 LAB — CBC WITH DIFFERENTIAL/PLATELET
Basophils Absolute: 0 cells/uL (ref 0–200)
Basophils Relative: 0 %
Eosinophils Absolute: 204 cells/uL (ref 15–500)
Eosinophils Relative: 4 %
HCT: 40.6 % (ref 38.5–50.0)
Hemoglobin: 13.5 g/dL (ref 13.2–17.1)
Lymphocytes Relative: 27 %
Lymphs Abs: 1377 cells/uL (ref 850–3900)
MCH: 30.3 pg (ref 27.0–33.0)
MCHC: 33.3 g/dL (ref 32.0–36.0)
MCV: 91.2 fL (ref 80.0–100.0)
MPV: 9 fL (ref 7.5–12.5)
Monocytes Absolute: 663 cells/uL (ref 200–950)
Monocytes Relative: 13 %
Neutro Abs: 2856 cells/uL (ref 1500–7800)
Neutrophils Relative %: 56 %
Platelets: 235 10*3/uL (ref 140–400)
RBC: 4.45 MIL/uL (ref 4.20–5.80)
RDW: 13.7 % (ref 11.0–15.0)
WBC: 5.1 10*3/uL (ref 3.8–10.8)

## 2016-07-25 LAB — COMPLETE METABOLIC PANEL WITH GFR
ALT: 18 U/L (ref 9–46)
AST: 21 U/L (ref 10–40)
Albumin: 4.3 g/dL (ref 3.6–5.1)
Alkaline Phosphatase: 58 U/L (ref 40–115)
BUN: 18 mg/dL (ref 7–25)
CO2: 28 mmol/L (ref 20–31)
Calcium: 9.4 mg/dL (ref 8.6–10.3)
Chloride: 104 mmol/L (ref 98–110)
Creat: 1.01 mg/dL (ref 0.60–1.35)
GFR, Est African American: >89 mL/min (ref >=60)
GFR, Est Non African American: 87 mL/min (ref >=60)
Glucose, Bld: 98 mg/dL (ref 65–99)
Potassium: 4.4 mmol/L (ref 3.5–5.3)
Sodium: 138 mmol/L (ref 135–146)
Total Bilirubin: 0.4 mg/dL (ref 0.2–1.2)
Total Protein: 6.9 g/dL (ref 6.1–8.1)

## 2016-07-25 MED ORDER — FOLIC ACID 1 MG PO TABS: 2.0000 mg | ORAL_TABLET | Freq: Every day | ORAL | 4 refills | Status: DC

## 2016-07-25 MED ORDER — METHOTREXATE (PF) 25 MG/0.5ML ~~LOC~~ SOAJ: 25.0000 mg | SUBCUTANEOUS | 0 refills | Status: DC

## 2016-07-25 NOTE — Patient Instructions (Signed)
Supplements for OA Natural anti-inflammatories  You can purchase these at [employer], [employer] or online.  . Turmeric (capsules)  . Ginger (ginger root or capsules)  . Omega 3 (Fish, flax seeds, chia seeds, walnuts, almonds)  . Tart cherry (dried or extract)   Patient should be under the care of a physician while taking these supplements. This may not be reproduced without the permission of Dr. Shaili Deveshwar.  

## 2016-07-25 NOTE — Progress Notes (Signed)
Office Visit Note  Patient: Alexander Nelson             Date of Birth: November 25, 1965           MRN: [id number]             PCP: Alexander Nelson Referring: Alexander Nelson Visit Date: 07/25/2016 Occupation: @GUAROCC @    Subjective:  Follow-up Follow up on sarcoidosis, uveitis, vitamin D deficiency, high-risk prescription.  History of Present Illness: Alexander Nelson is a 50 y.o. male  Who was last seen in our office by Alexander Nelson 02/22/2016. [name] with the sarcoidosis. No shortness of breath. Note that the sarcoidosis was proven with positive mediastinal lymph node biopsies. He had a history of granuloma on the biopsy.  He has no flare of his uveitis.  He is using Otrexup 25 mg every week He was on prednisone 10 mg every day on 02/22/2016 visit and with the advice of Alexander Nelson are, patient is tapered off of the prednisone by the end of September 2017.  Note that patient had knee joint pain June 2017 and that is all resolved. We did take the opportunity discussed Visco supplementation at length and patient will let us know should he need any knee injections in the future. He knows that we have to apply for the medication first however. He also knows that we have to do conservative treatment first.  He also had vitamin D deficiency at one time and he thinks the levels of vitamin D was less than 10. [facility] [place] [facility], put him on 5000 international units of vitamin D3 every day. We will recheck his vitamin D levels today on labs. He has not had labs done in a while. His last labs were from May 2017.    Activities of Daily Living:  Patient reports morning stiffness for 15 minutes.   Patient Denies nocturnal pain.  Difficulty dressing/grooming: Denies Difficulty climbing stairs: Denies Difficulty getting out of chair: Denies Difficulty using hands for taps, buttons, cutlery, and/or writing:  Denies   Review of Systems  Constitutional: Negative for fatigue.  HENT: Negative for mouth sores and mouth dryness.   Eyes: Negative for dryness.  Respiratory: Negative for shortness of breath.   Gastrointestinal: Negative for constipation and diarrhea.  Musculoskeletal: Negative for myalgias and myalgias.  Skin: Negative for sensitivity to sunlight.  Neurological: Negative for memory loss.  Psychiatric/Behavioral: Negative for sleep disturbance.    PMFS History:  Patient Active Problem List   Diagnosis Date Noted  . Sarcoidosis (HCC) 10/18/2014    Past Medical History:  Diagnosis Date  . Abnormal chest x-ray 3 weeks ago  . Sarcoidosis (HCC)     Family History  Problem Relation Age of Onset  . Diabetes Mother   . Heart attack Father    Past Surgical History:  Procedure Laterality Date  . CATARACT EXTRACTION Left   . ENDOBRONCHIAL ULTRASOUND Bilateral 11/14/2014   Procedure: ENDOBRONCHIAL ULTRASOUND;  Surgeon: Barbaraann ShareKeith M Clance, Nelson;  Location: [facility];  Service: Endoscopy;  Laterality: Bilateral;  . INGUINAL HERNIA REPAIR Bilateral 04/04/2016   Procedure: LAPAROSCOPIC BILATERAL INGUINAL HERNIA REPAIR;  Surgeon: Avel Peaceodd Rosenbower, Nelson;  Location: [facility];  Service: General;  Laterality: Bilateral;  . INSERTION OF MESH Bilateral 04/04/2016   Procedure: INSERTION OF MESH;  Surgeon: Avel Peaceodd Rosenbower, Nelson;  Location: [facility];  Service: General;  Laterality: Bilateral;  . REFRACTIVE SURGERY  2002  .  TONSILLECTOMY  age 72   Social History   Social History Narrative  . No narrative on file     Objective: Vital Signs: BP 90/60 (BP Location: Left Arm, Patient Position: Sitting, Cuff Size: Large)   Pulse 66   Resp 14   Ht 6\' 1"  (1.854 m)   Wt 185 lb (83.9 kg)   BMI 24.41 kg/m    Physical Exam  Constitutional: He is oriented to person, place, and time. He appears well-developed and well-nourished.  HENT:  Head: Normocephalic and atraumatic.  Eyes: Conjunctivae and EOM are  normal. Pupils are equal, round, and reactive to light.  Neck: Normal range of motion. Neck supple.  Cardiovascular: Normal rate, regular rhythm and normal heart sounds.  Exam reveals no gallop and no friction rub.   No murmur heard. Pulmonary/Chest: Effort normal and breath sounds normal. No respiratory distress. He has no wheezes. He has no rales. He exhibits no tenderness.  Note last week patient had upper respiratory infection (viral). On examination all of his lung fields are normal in all 4 lung fields. No rales, no rhonchi.    Abdominal: Soft. He exhibits no distension and no mass. There is no tenderness. There is no guarding.  Musculoskeletal: Normal range of motion.  Lymphadenopathy:    He has no cervical adenopathy.  Neurological: He is alert and oriented to person, place, and time. He exhibits normal muscle tone. Coordination normal.  Skin: Skin is warm and dry. Capillary refill takes less than 2 seconds. No rash noted.  Psychiatric: He has a normal mood and affect. His behavior is normal. Judgment and thought content normal.  Vitals reviewed.    Musculoskeletal Exam:   Full range of motion of all joints  Grip strength is equal and strong bilaterally Fibromyalgia tender points are all absent  CDAI Exam: CDAI Homunculus Exam:   Joint Counts:  CDAI Tender Joint count: 0 CDAI Swollen Joint count: 0   No synovitis on examination.  Investigation: No additional findings. Last labs are from May 2017 at which his CMP was normal and CBC was normal. Vitamin D was 39.4. This was noted on the patient's phone. We did not have a hard copy of those labs. He is due for labs updated today.  Imaging: No results found.  Speciality Comments: No specialty comments available.    Procedures:  No procedures performed Allergies: Bee venom   Assessment / Plan: Visit Diagnoses: High risk medications (not anticoagulants) long-term use - Plan: CBC with Differential/Platelet,  COMPLETE METABOLIC PANEL WITH GFR, VITAMIN D 25 Hydroxy (Vit-D Deficiency, Fractures)  Sarcoidosis (HCC) - +Mediastinal LN's; +Bx Granuloma; - Plan: Methotrexate, PF, 25 MG/0.5ML SOAJ  Vitamin D deficiency  Pain, joint, knee, right - resolved late june 2017;   Patient was interested in stopping the methotrexate eventually and had a shunt regarding that. We advised that patient needs to be symptom-free/no flares for at least 2 years. When that. Of time has occurred, we can decrease Otrexup to a lower dose and see how well he responds to that and then continue that methotrexate taper over time and see if if he does or does not clear. If he does for now not flare, it would be prudent to stop the methotrexate and see if he is in remission.  For now, continue Otrexup 25 mg weekly, folic acid 2 mg every day.  CBC with differential, CMP with GFR, vitamin D 25 OH today  Continue over-the-counter vitamin D3 5000 international units. We are checking  the vitamin D levels today and we will make appropriate changes pending on what the level showed today  CBC with differential CMP with GFR in 3 months from today  Return to clinic in 5 months for regular follow-up  Patient is status post left cataract surgery. This was done August 2017. Patient is doing well. He has follow-up 1 with Dr. Allyne GeeSanders January 2018. Note he only complains of floaters after the surgery. Note that the right eye is currently doing well and they're not planning on doing cataract surgery at this time.  2 samples of Otrexup 25 mg were given to the patient today. They were locked out in the log book by Ms. Amy Littrell  Otrexup was called in to [facility] mail order. 25 mg dispense three-month supply with no refills  Orders: Orders Placed This Encounter  Procedures  . CBC with Differential/Platelet  . COMPLETE METABOLIC PANEL WITH GFR  . VITAMIN D 25 Hydroxy (Vit-D Deficiency, Fractures)   Meds ordered this encounter  Medications  .  folic acid (FOLVITE) 1 MG tablet    Sig: Take 2 tablets (2 mg total) by mouth daily.    Dispense:  90 tablet    Refill:  4    Order Specific Question:   Supervising Provider    Answer:   Pollyann SavoyEVESHWAR, SHAILI [[id number]]  . Methotrexate, PF, 25 MG/0.5ML SOAJ    Sig: Inject 25 mg into the skin once a week.    Dispense:  12 pen    Refill:  0    Face-to-face time spent with patient was 40 minutes. 50% of time was spent in counseling and coordination of care.  Follow-Up Instructions: Return in about 5 months (around 12/23/2016) for sarcoidosis, hrrx, left eye cataract surgery,uri, vit d deficiency.   Tawni PummelNaitik Rogelio Winbush, PA-C   I examined and evaluated the patient with Tawni PummelNaitik Sanye Ledesma PA. The plan of care was discussed as noted above.  Pollyann SavoyShaili Deveshwar, Nelson

## 2016-07-26 LAB — VITAMIN D 25 HYDROXY (VIT D DEFICIENCY, FRACTURES): Vit D, 25-Hydroxy: 68 ng/mL (ref 30–100)

## 2016-07-26 NOTE — Progress Notes (Signed)
*   I already spoke w/ patient...Alexander Nelson..Alexander Nelson

## 2016-07-26 NOTE — Progress Notes (Signed)
Claris PongHoward E spoke with the patient called him and gave him the information below but for the record I have written it down.   #1) vitamin D is normal at 68. Continue vitamin D3, 5000 international units once a week starting 4 weeks from now  #2) CMP with GFR and CBC with differential normal No change in treatment

## 2016-10-21 ENCOUNTER — Other Ambulatory Visit: Payer: Self-pay | Admitting: Rheumatology

## 2016-10-21 DIAGNOSIS — D869 Sarcoidosis, unspecified: Secondary | ICD-10-CM

## 2016-10-21 NOTE — Telephone Encounter (Signed)
Last Visit: 07/25/16 Next Visit: 12/23/16 Labs: 07/25/17 Low Vit D Patient reminded he is due for labs this month  Okay to refill Rasuvo?

## 2016-11-06 ENCOUNTER — Telehealth: Payer: Self-pay | Admitting: Rheumatology

## 2016-11-06 NOTE — Telephone Encounter (Signed)
Patient would like to know if the office received his lab work from Dr. Karmen StabsBarker's office? Please let patient know.

## 2016-11-07 NOTE — Telephone Encounter (Signed)
Patient advised we have not received a copy of his labs yet. Patient will contact Dr. Dewaine CongerBarker and have them resend the labs.

## 2016-11-13 ENCOUNTER — Telehealth: Payer: Self-pay | Admitting: Rheumatology

## 2016-11-13 NOTE — Telephone Encounter (Signed)
Received CBC and CMP which were drawn on 10/29/16 ans were normal .

## 2016-11-13 NOTE — Telephone Encounter (Signed)
Patient called to make sure that we have received his lab results.  Cb#[phone].  Thank you.

## 2016-11-25 ENCOUNTER — Other Ambulatory Visit: Payer: Self-pay | Admitting: *Deleted

## 2016-11-25 MED ORDER — FOLIC ACID 1 MG PO TABS: 2.0000 mg | ORAL_TABLET | Freq: Every day | ORAL | 4 refills | Status: DC

## 2016-11-25 NOTE — Telephone Encounter (Signed)
Refill request received via fax  Last Visit: 07/25/16 Next Visit: 12/23/16  Okay to refill Folic Acid?

## 2016-12-16 DIAGNOSIS — H209 Unspecified iridocyclitis: Secondary | ICD-10-CM | POA: Insufficient documentation

## 2016-12-16 DIAGNOSIS — Z79899 Other long term (current) drug therapy: Secondary | ICD-10-CM | POA: Insufficient documentation

## 2016-12-16 NOTE — Progress Notes (Signed)
Office Visit Note  Patient: Alexander Nelson             Date of Birth: [birth year]           MRN: [id number]             PCP: Golden Hurter, MD Referring: Golden Hurter, MD Visit Date: 12/23/2016 Occupation: '@GUAROCC' @    Subjective:  Follow-up   History of Present Illness: Alexander Nelson is a 51 y.o. male with history of sarcoidosis and uveitis. He states he hasn't had uveitis flare in a long time. He developed shingles on his right lower extremity about 2 weeks ago which was treated with antiviral therapy and prednisone for one week. Is gradually resolving. He has some discomfort at the site of shingles infection. He denies any joint pain or swelling. No shortness of breath.  Activities of Daily Living:  Patient reports morning stiffness for 0 minute.   Patient Denies nocturnal pain.  Difficulty dressing/grooming: Denies Difficulty climbing stairs: Denies Difficulty getting out of chair: Denies Difficulty using hands for taps, buttons, cutlery, and/or writing: Denies   Review of Systems  Constitutional: Negative for fatigue, night sweats and weakness ( ).  HENT: Negative for mouth sores, mouth dryness and nose dryness.   Eyes: Negative for redness and dryness.  Respiratory: Negative for shortness of breath and difficulty breathing.   Cardiovascular: Negative for chest pain, palpitations, hypertension, irregular heartbeat and swelling in legs/feet.  Gastrointestinal: Negative for constipation and diarrhea.  Endocrine: Negative for increased urination.  Musculoskeletal: Negative for arthralgias, joint pain, joint swelling, myalgias, muscle weakness, morning stiffness, muscle tenderness and myalgias.  Skin: Positive for rash. Negative for color change, hair loss, nodules/bumps, skin tightness, ulcers and sensitivity to sunlight.       Recent shingles  Allergic/Immunologic: Negative for susceptible to infections.  Neurological: Negative for dizziness, fainting, memory loss  and night sweats.  Hematological: Negative for swollen glands.  Psychiatric/Behavioral: Negative for depressed mood and sleep disturbance. The patient is not nervous/anxious.     PMFS History:  Patient Active Problem List   Diagnosis Date Noted  . Vitamin D deficiency 12/17/2016  . High risk medication use 12/16/2016  . Uveitis 12/16/2016  . Sarcoidosis 10/18/2014    Past Medical History:  Diagnosis Date  . Abnormal chest x-ray 3 weeks ago  . Sarcoidosis     Family History  Problem Relation Age of Onset  . Diabetes Mother   . Heart attack Father    Past Surgical History:  Procedure Laterality Date  . CATARACT EXTRACTION Left   . ENDOBRONCHIAL ULTRASOUND Bilateral 11/14/2014   Procedure: ENDOBRONCHIAL ULTRASOUND;  Surgeon: Kathee Delton, MD;  Location: [facility];  Service: Endoscopy;  Laterality: Bilateral;  . INGUINAL HERNIA REPAIR Bilateral 04/04/2016   Procedure: LAPAROSCOPIC BILATERAL INGUINAL HERNIA REPAIR;  Surgeon: Jackolyn Confer, MD;  Location: [facility];  Service: General;  Laterality: Bilateral;  . INSERTION OF MESH Bilateral 04/04/2016   Procedure: INSERTION OF MESH;  Surgeon: Jackolyn Confer, MD;  Location: [facility];  Service: General;  Laterality: Bilateral;  . REFRACTIVE SURGERY  2002  . TONSILLECTOMY  age 75   Social History   Social History Narrative  . No narrative on file     Objective: Vital Signs: BP 100/60   Pulse 70   Resp 14   Ht '6\' 1"'  (1.854 m)   Wt 183 lb (83 kg)   BMI 24.14 kg/m    Physical Exam  Constitutional: He is oriented to  person, place, and time. He appears well-developed and well-nourished.  HENT:  Head: Normocephalic and atraumatic.  Eyes: Conjunctivae and EOM are normal. Pupils are equal, round, and reactive to light.  No conjunctival injection was noted.  Neck: Normal range of motion. Neck supple.  Cardiovascular: Normal rate, regular rhythm and normal heart sounds.   Pulmonary/Chest: Effort normal and breath sounds normal.    Abdominal: Soft. Bowel sounds are normal.  Neurological: He is alert and oriented to person, place, and time.  Skin: Skin is warm and dry. Capillary refill takes less than 2 seconds.  Psychiatric: He has a normal mood and affect. His behavior is normal.  Nursing note and vitals reviewed.    Musculoskeletal Exam: C-spine and thoracic lumbar spine good range of motion. Shoulder joints elbow joints wrist joint MCPs PIPs DIPs with good range of motion with no synovitis hip joints knee joints ankles MTPs PIPs DIPs are good range of motion with no synovitis.  CDAI Exam: No CDAI exam completed.    Investigation: Findings:  February 2017:  CBC, comprehensive metabolic panel, TSH, UA were negative.  Vitamin D was 13.9.  March 2017:  CBC and comprehensive metabolic panel were normal.  His LDL was 123.    I have report of his CT scan, which I got from Epic and according to that, his CT scan of his chest was done in February of 2016, which showed mediastinal and hilar lymph nodes with scattered air space nodules consistent with sarcoidosis.  According to the records from Dr. Lake Bells, he also had ultrasound-guided nodule biopsy, which showed some "granuloma-like fragments."      Sarcoidosis.  He has mediastinal lymph node, biopsy-proven.    08/29/2015 Sed rate, CK, ANA, rheumatoid factor, CCP, hep panel, TB Gold and SPEP normal  11/06/2016 CBC normal, CMP normal   No visits with results within 3 Month(s) from this visit.  Latest known visit with results is:  Office Visit on 07/25/2016  Component Date Value Ref Range Status  . WBC 07/25/2016 5.1  3.8 - 10.8 K/uL Final  . RBC 07/25/2016 4.45  4.20 - 5.80 MIL/uL Final  . Hemoglobin 07/25/2016 13.5  13.2 - 17.1 g/dL Final  . HCT 07/25/2016 40.6  38.5 - 50.0 % Final  . MCV 07/25/2016 91.2  80.0 - 100.0 fL Final  . MCH 07/25/2016 30.3  27.0 - 33.0 pg Final  . MCHC 07/25/2016 33.3  32.0 - 36.0 g/dL Final  . RDW 07/25/2016 13.7  11.0 - 15.0 % Final   . Platelets 07/25/2016 235  140 - 400 K/uL Final  . MPV 07/25/2016 9.0  7.5 - 12.5 fL Final  . Neutro Abs 07/25/2016 2856  1,500 - 7,800 cells/uL Final  . Lymphs Abs 07/25/2016 1377  850 - 3,900 cells/uL Final  . Monocytes Absolute 07/25/2016 663  200 - 950 cells/uL Final  . Eosinophils Absolute 07/25/2016 204  15 - 500 cells/uL Final  . Basophils Absolute 07/25/2016 0  0 - 200 cells/uL Final  . Neutrophils Relative % 07/25/2016 56  % Final  . Lymphocytes Relative 07/25/2016 27  % Final  . Monocytes Relative 07/25/2016 13  % Final  . Eosinophils Relative 07/25/2016 4  % Final  . Basophils Relative 07/25/2016 0  % Final  . Smear Review 07/25/2016 Criteria for review not met   Final  . Sodium 07/25/2016 138  135 - 146 mmol/L Final  . Potassium 07/25/2016 4.4  3.5 - 5.3 mmol/L Final  . Chloride 07/25/2016 104  98 - 110 mmol/L Final  . CO2 07/25/2016 28  20 - 31 mmol/L Final  . Glucose, Bld 07/25/2016 98  65 - 99 mg/dL Final  . BUN 07/25/2016 18  7 - 25 mg/dL Final  . Creat 07/25/2016 1.01  0.60 - 1.35 mg/dL Final  . Total Bilirubin 07/25/2016 0.4  0.2 - 1.2 mg/dL Final  . Alkaline Phosphatase 07/25/2016 58  40 - 115 U/L Final  . AST 07/25/2016 21  10 - 40 U/L Final  . ALT 07/25/2016 18  9 - 46 U/L Final  . Total Protein 07/25/2016 6.9  6.1 - 8.1 g/dL Final  . Albumin 07/25/2016 4.3  3.6 - 5.1 g/dL Final  . Calcium 07/25/2016 9.4  8.6 - 10.3 mg/dL Final  . GFR, Est African American 07/25/2016 >89  >=60 mL/min Final  . GFR, Est Non African American 07/25/2016 87  >=60 mL/min Final  . Vit D, 25-Hydroxy 07/25/2016 68  30 - 100 ng/mL Final   Comment: Vitamin D Status           25-OH Vitamin D        Deficiency                <20 ng/mL        Insufficiency         20 - 29 ng/mL        Optimal             > or = 30 ng/mL   For 25-OH Vitamin D testing on patients on D2-supplementation and patients for whom quantitation of D2 and D3 fractions is required, the QuestAssureD 25-OH VIT D,  (D2,D3), LC/MS/MS is recommended: order code [phone] (patients > 2 yrs).     Imaging: No results found.  Speciality Comments: No specialty comments available.    Procedures:  No procedures performed Allergies: Bee venom   Assessment / Plan:     Visit Diagnoses: Sarcoidosis [facility]): Doing well no synovitis on examination.  High risk medication use - Rasuvo 25 mg subcutaneous every week, folic acid 2 mg by mouth daily. His labs in November were normal. According to him had recent labs were done in [place] will be do not have those available. We will draw labs today to monitor for drug toxicity.  Uveitis: He has had no recurrence of uveitis.  Vitamin D deficiency  History of cataract extraction    Orders: Orders Placed This Encounter  Procedures  . CBC with Differential/Platelet  . COMPLETE METABOLIC PANEL WITH GFR   No orders of the defined types were placed in this encounter.   Face-to-face time spent with patient was 20 minutes. 50% of time was spent in counseling and coordination of care.  Follow-Up Instructions: Return in about 6 months (around 06/24/2017) for Sarcoidosis.   Bo Merino, MD  Note - This record has been created using Editor, commissioning.  Chart creation errors have been sought, but may not always  have been located. Such creation errors do not reflect on  the standard of medical care.

## 2016-12-17 DIAGNOSIS — E559 Vitamin D deficiency, unspecified: Secondary | ICD-10-CM | POA: Insufficient documentation

## 2016-12-23 ENCOUNTER — Encounter: Payer: Self-pay | Admitting: Rheumatology

## 2016-12-23 ENCOUNTER — Ambulatory Visit (INDEPENDENT_AMBULATORY_CARE_PROVIDER_SITE_OTHER): Payer: BLUE CROSS/BLUE SHIELD | Admitting: Rheumatology

## 2016-12-23 VITALS — BP 100/60 | HR 70 | Resp 14 | Ht 73.0 in | Wt 183.0 lb

## 2016-12-23 DIAGNOSIS — H209 Unspecified iridocyclitis: Secondary | ICD-10-CM

## 2016-12-23 DIAGNOSIS — E559 Vitamin D deficiency, unspecified: Secondary | ICD-10-CM

## 2016-12-23 DIAGNOSIS — Z9849 Cataract extraction status, unspecified eye: Secondary | ICD-10-CM

## 2016-12-23 DIAGNOSIS — Z79899 Other long term (current) drug therapy: Secondary | ICD-10-CM | POA: Diagnosis not present

## 2016-12-23 DIAGNOSIS — D869 Sarcoidosis, unspecified: Secondary | ICD-10-CM

## 2016-12-23 LAB — CBC WITH DIFFERENTIAL/PLATELET
Basophils Absolute: 0 cells/uL (ref 0–200)
Basophils Relative: 0 %
Eosinophils Absolute: 201 cells/uL (ref 15–500)
Eosinophils Relative: 3 %
HCT: 41.5 % (ref 38.5–50.0)
Hemoglobin: 14.2 g/dL (ref 13.2–17.1)
Lymphocytes Relative: 19 %
Lymphs Abs: 1273 cells/uL (ref 850–3900)
MCH: 30.9 pg (ref 27.0–33.0)
MCHC: 34.2 g/dL (ref 32.0–36.0)
MCV: 90.4 fL (ref 80.0–100.0)
MPV: 9.1 fL (ref 7.5–12.5)
Monocytes Absolute: 804 cells/uL (ref 200–950)
Monocytes Relative: 12 %
Neutro Abs: 4422 cells/uL (ref 1500–7800)
Neutrophils Relative %: 66 %
Platelets: 193 10*3/uL (ref 140–400)
RBC: 4.59 MIL/uL (ref 4.20–5.80)
RDW: 14.8 % (ref 11.0–15.0)
WBC: 6.7 10*3/uL (ref 3.8–10.8)

## 2016-12-23 NOTE — Patient Instructions (Signed)
Standing Labs We placed an order today for your standing lab work.    Please come back and get your standing labs in 3 months  / if you have them drawn at your PCP have them faxed to [place] at [phone]  We have open lab Monday through Friday from 8:30-11:30 AM and 1:30-4 PM at the office of Dr. Arbutus Ped, PA.   The office is located at [address], [address], [place], [place] [address] No appointment is necessary.   Labs are drawn by [facility].  You may receive a bill from [facility] for your lab work.

## 2016-12-24 LAB — COMPLETE METABOLIC PANEL WITH GFR
ALT: 22 U/L (ref 9–46)
AST: 20 U/L (ref 10–40)
Albumin: 4.3 g/dL (ref 3.6–5.1)
Alkaline Phosphatase: 65 U/L (ref 40–115)
BUN: 22 mg/dL (ref 7–25)
CO2: 26 mmol/L (ref 20–31)
Calcium: 9.6 mg/dL (ref 8.6–10.3)
Chloride: 103 mmol/L (ref 98–110)
Creat: 0.97 mg/dL (ref 0.60–1.35)
GFR, Est African American: >89 mL/min (ref >=60)
GFR, Est Non African American: >89 mL/min (ref >=60)
Glucose, Bld: 108 mg/dL — ABNORMAL HIGH (ref 65–99)
Potassium: 4.9 mmol/L (ref 3.5–5.3)
Sodium: 141 mmol/L (ref 135–146)
Total Bilirubin: 0.6 mg/dL (ref 0.2–1.2)
Total Protein: 6.9 g/dL (ref 6.1–8.1)

## 2016-12-24 NOTE — Progress Notes (Signed)
WNL

## 2017-01-01 ENCOUNTER — Other Ambulatory Visit: Payer: Self-pay | Admitting: Rheumatology

## 2017-01-01 DIAGNOSIS — D869 Sarcoidosis, unspecified: Secondary | ICD-10-CM

## 2017-01-01 NOTE — Telephone Encounter (Signed)
Labs and office visit last week.  Follow up sch for October Ok to refill per Dr Corliss Skains

## 2017-03-13 ENCOUNTER — Telehealth: Payer: Self-pay | Admitting: Pulmonary Disease

## 2017-03-13 NOTE — Telephone Encounter (Signed)
As of right now your next day in the office is 7/23

## 2017-03-13 NOTE — Telephone Encounter (Signed)
Spoke with pt and he states he has a on going cough that has not improved. He states he was able to get in with his PCP today to be seen. Pt states he will have the OV notes faxed to us so that BQ can have to review. Pt states he is coughing with some phlegm,he states his breathing is ok, has some chest congestion. Denies wheezing,fever. Did not see the fax from his pcp of his CT scan. Called their office and they stated they will refax his most recent scan but unsure as to why the pt wants us to see this as it was just looking at the heart.  Spoke with pt and he stated that this was correct but they also stated they saw some lung nodules as to why he felt like BQ should see report.   Will forward to [place] to follow up on to assure that BQ gets the report. As of now no fax as been received

## 2017-03-13 NOTE — Telephone Encounter (Signed)
Overbook next Thursda

## 2017-03-13 NOTE — Telephone Encounter (Signed)
CT heart is not available in Epic but is available for viewing in PACS.    BQ please advise.  Thanks!

## 2017-03-13 NOTE — Telephone Encounter (Signed)
Spoke with patient. He has been scheduled with BQ at 12p. PFT at 3p. I advised the patient that it would be better for him to have the test done before his appt but he refused. Pt is aware of appts. Nothing further needed.

## 2017-03-13 NOTE — Telephone Encounter (Signed)
I spoke with the pt and notified of recs per BQ  He will obtain disc  I scheduled PFT to be done 03/21/17  BQ- do you want to be the one to see him for f/u? Overbook? We have limited providers here in the office and not any openings with NP's for at least the next month

## 2017-03-13 NOTE — Telephone Encounter (Signed)
7/12 AM clinic for BQ has been opened.

## 2017-03-13 NOTE — Telephone Encounter (Signed)
I have called the pt to schedule the appt on 7/12 with BQ, but the schedule has not been opened as of yet.  The pt is coming from out of town for this appt and is scheduled for PFT on Friday 7/13 and would like to do the PFT and the appt on the same day.  Will need to schedule this for the pt.

## 2017-03-13 NOTE — Telephone Encounter (Signed)
He must have had the images performed outside out area because they are not visible in PACS.   We need: -a CD of the CT scan images He needs -PFT -Acute visit post PFT -would be helpful if he can have the visit after the images have arrived to our office

## 2017-03-18 NOTE — Telephone Encounter (Signed)
Received CT report from [facility]. Will place in BQ's look-at folder.

## 2017-03-20 ENCOUNTER — Ambulatory Visit (INDEPENDENT_AMBULATORY_CARE_PROVIDER_SITE_OTHER): Payer: BLUE CROSS/BLUE SHIELD | Admitting: Pulmonary Disease

## 2017-03-20 ENCOUNTER — Encounter: Payer: Self-pay | Admitting: Pulmonary Disease

## 2017-03-20 VITALS — BP 110/60 | HR 65 | Ht 73.0 in | Wt 185.0 lb

## 2017-03-20 DIAGNOSIS — R059 Cough, unspecified: Secondary | ICD-10-CM

## 2017-03-20 DIAGNOSIS — R0982 Postnasal drip: Secondary | ICD-10-CM

## 2017-03-20 DIAGNOSIS — R05 Cough: Secondary | ICD-10-CM | POA: Diagnosis not present

## 2017-03-20 DIAGNOSIS — D869 Sarcoidosis, unspecified: Secondary | ICD-10-CM | POA: Diagnosis not present

## 2017-03-20 NOTE — Patient Instructions (Signed)
Cough: Take Delsym over-the-counter twice a day for 3 days and try to suppress the cough. During this time don't want to talk bouncing.  For your sinus congestion and postnasal drip: Take over-the-counter Lloyd HugerNeil med rinses and chlorpheniramine-phenylephrine combination tablets as directed  For your sarcoidosis: Please get me a copy of the CT scan which was performed recently We will get a lung function test in one year when you follow-up  We'll see you back in one year or sooner if needed

## 2017-03-20 NOTE — Progress Notes (Signed)
Subjective:    Patient ID: Alexander Nelson, male    DOB: 07/12/1966, 51 y.o.   MRN: [id number]  Synopsis: Former patient of Dr. Shelle Ironlance who has a constellation of symptoms that seem consistent with sarcoidosis.The majority of his symptoms have been associated with uveitis.   HPI Chief Complaint  Patient presents with  . Follow-up    Pt had a scan two to three weeks ago and was noted to have lung nodules on the report. Prod. cough w/ clear mucus x3weeks.   Alexander Nelson says that for the last year he's been doing fine, exercising regularly at high intensity without feeling shortness of breath. However, approximately 4 weeks ago he developed a dry cough around the time that he got sick traveling. He said that he had a 4 x 4 work and he got a severe sinus infection. Last week he was treated with amoxicillin. Despite this he continues to have sinus congestion and postnasal drip. He has a cough which is worse in the evenings and sometimes productive of clear mucus. He says that he takes NyQuil over-the-counter which is helped to some degree. He denies shortness of breath, chest tightness or wheezing.    Past Medical History:  Diagnosis Date  . Abnormal chest x-ray 3 weeks ago  . Sarcoidosis       Review of Systems  Constitutional: Negative for chills, fatigue and fever.  HENT: Positive for postnasal drip and sinus pressure. Negative for sinus pain.   Respiratory: Positive for cough. Negative for shortness of breath and wheezing.   Cardiovascular: Negative for chest pain, palpitations and leg swelling.       Objective:   Physical Exam  Vitals:   03/20/17 1227  BP: 110/60  Pulse: 65  SpO2: 96%  Weight: 185 lb (83.9 kg)  Height: 6\' 1"  (1.854 m)  RA  General:  Resting comfortably in bed HENT: NCAT OP clear PULM: CTA B, normal effort CV: RRR, no mgr GI: BS+, soft, nontender MSK: normal bulk and tone Neuro: awake, alert, no distress, MAEW   PFT: March 2017 pulmonary function  testing ratio 83%, FEV1 4.60 L (100% predicted, 7% change with bronchodilator), FVC 5.55 L (94% predicted), total lung capacity 7.44 L (94% predicted), DLCO 32.12 (83% predicted).  Chest imaging: CXR 2016:  Mediastinal LN, no obvious infiltrate.  Labs: ACE 2016:  91 HIV 2016: negative CMET/EKG normal 11/2014  Path: EBUS 11/2014:  Lymphocytes seen, rare granuloma-like fragment       Assessment & Plan:  Sarcoidosis  Cough  Post-nasal drip  Discussion: He's had a cough in the last several weeks which I believe is due to postnasal drip from an upper respiratory infection. However, given his history of sarcoidosis it's best breast to assess for any evidence of underlying disease activity. So I am going to request records and images from his most recent CT chest of that I can review them myself and we will get lung function testing in about a month when the cough is ever.  In regards to the cough, I advised over-the-counter decongestants and saline rinses to help with the postnasal drip. There is also likely a component of sickle coughs up given him advice on how to deal with that with voice rest.  Plan: Cough: Take Delsym over-the-counter twice a day for 3 days and try to suppress the cough. During this time don't want to talk bouncing.  For your sinus congestion and postnasal drip: Take over-the-counter Lloyd HugerNeil med rinses and chlorpheniramine-phenylephrine combination tablets  as directed  For your sarcoidosis: Please get me a copy of the CT scan which was performed recently We will get a lung function test in one year when you follow-up  We'll see you back in one year or sooner if needed   Current Outpatient Prescriptions:  .  amoxicillin-clavulanate (AUGMENTIN) 875-125 MG tablet, TK 1 T PO Q 12 H UNTIL GONE, Disp: , Rfl: 0 .  benzonatate (TESSALON) 200 MG capsule, TK 1 C PO TID PRN, Disp: , Rfl: 0 .  Cholecalciferol (VITAMIN D3) 5000 units TABS, Take 1 tablet by mouth daily.,  Disp: , Rfl:  .  EPINEPHrine 0.3 mg/0.3 mL IJ SOAJ injection, Inject into the muscle as needed., Disp: , Rfl: 2 .  folic acid (FOLVITE) 1 MG tablet, Take 2 tablets (2 mg total) by mouth daily., Disp: 90 tablet, Rfl: 4 .  RASUVO 25 MG/0.5ML SOAJ, INJECT ONE PEN (25 MG) SUBCUTANEOUSLY ONCE EVERY WEEK. STORE AT ROOM TEMPERATURE BETWEEN 68 - 77 DEGREES F., Disp: 12 pen, Rfl: 0 .  traZODone (DESYREL) 50 MG tablet, Take 50 mg by mouth at bedtime as needed for sleep., Disp: , Rfl:

## 2017-03-26 ENCOUNTER — Other Ambulatory Visit: Payer: Self-pay | Admitting: Rheumatology

## 2017-03-26 DIAGNOSIS — D869 Sarcoidosis, unspecified: Secondary | ICD-10-CM

## 2017-03-26 NOTE — Telephone Encounter (Signed)
Last Visit: 12/23/16 Next Visit: 06/23/17 Labs: 12/23/16 WNL  Left message to remind he is due to update labs this month.   Okay to refill per Dr. Corliss Skainseveshwar.

## 2017-04-21 ENCOUNTER — Ambulatory Visit (INDEPENDENT_AMBULATORY_CARE_PROVIDER_SITE_OTHER): Payer: BLUE CROSS/BLUE SHIELD | Admitting: Pulmonary Disease

## 2017-04-21 DIAGNOSIS — D869 Sarcoidosis, unspecified: Secondary | ICD-10-CM

## 2017-04-21 LAB — PULMONARY FUNCTION TEST
DL/VA % pred: 78 %
DL/VA: 3.85 ml/min/mmHg/L
DLCO unc % pred: 80 %
DLCO unc: 31.1 ml/min/mmHg
FEF 25-75 Post: 4.66 L/sec
FEF 25-75 Pre: 4.17 L/sec
FEF2575-%Change-Post: 11 %
FEF2575-%Pred-Post: 119 %
FEF2575-%Pred-Pre: 106 %
FEV1-%Change-Post: 2 %
FEV1-%Pred-Post: 103 %
FEV1-%Pred-Pre: 101 %
FEV1-Post: 4.67 L
FEV1-Pre: 4.57 L
FEV1FVC-%Change-Post: 4 %
FEV1FVC-%Pred-Pre: 100 %
FEV6-%Change-Post: -1 %
FEV6-%Pred-Post: 100 %
FEV6-%Pred-Pre: 102 %
FEV6-Post: 5.68 L
FEV6-Pre: 5.79 L
FEV6FVC-%Change-Post: 0 %
FEV6FVC-%Pred-Post: 103 %
FEV6FVC-%Pred-Pre: 103 %
FVC-%Change-Post: -2 %
FVC-%Pred-Post: 97 %
FVC-%Pred-Pre: 99 %
FVC-Post: 5.68 L
FVC-Pre: 5.81 L
Post FEV1/FVC ratio: 82 %
Post FEV6/FVC ratio: 100 %
Pre FEV1/FVC ratio: 79 %
Pre FEV6/FVC Ratio: 100 %
RV % pred: 101 %
RV: 2.31 L
TLC % pred: 108 %
TLC: 8.51 L

## 2017-04-21 NOTE — Progress Notes (Signed)
PFT done today. 

## 2017-05-08 ENCOUNTER — Telehealth: Payer: Self-pay | Admitting: Pulmonary Disease

## 2017-05-08 NOTE — Telephone Encounter (Signed)
Notes recorded by Lupita LeashMcQuaid, Douglas B, MD on 05/05/2017 at 5:48 PM EDT A, Please let the patient know this was OK Thanks, B  Left a message for patient to call back for his PFT results.

## 2017-05-09 NOTE — Telephone Encounter (Signed)
Patient is returning phone call.  °

## 2017-05-09 NOTE — Telephone Encounter (Signed)
lmomtcb x 2  

## 2017-05-09 NOTE — Telephone Encounter (Signed)
Spoke with pt, aware of results/recs.  Nothing further needed.  

## 2017-05-23 ENCOUNTER — Ambulatory Visit: Payer: BLUE CROSS/BLUE SHIELD | Admitting: Pulmonary Disease

## 2017-06-11 ENCOUNTER — Telehealth: Payer: Self-pay | Admitting: Rheumatology

## 2017-06-11 NOTE — Telephone Encounter (Signed)
Left message to advise patient we have not received labs.

## 2017-06-11 NOTE — Telephone Encounter (Signed)
Patient would like to know if we received labs from Dr. Dewaine Conger? Please advise.

## 2017-06-13 NOTE — Progress Notes (Signed)
Office Visit Note  Patient: Alexander Nelson             Date of Birth: [birth year]           MRN: [id number]             PCP: Brynda Peon, MD Referring: Brynda Peon, MD Visit Date: 06/23/2017 Occupation: @    Subjective:  Medication management.   History of Present Illness: Alexander Nelson is a 51 y.o. male with history of uveitis and sarcoidosis. He states he has not had any episodes of uveitis since the last visit.he had PFTs done about 2 months ago. He will have follow-up again in 1 year.he denies any joint pain or joint swelling.  Activities of Daily Living:  Patient reports morning stiffness for 0 minute.   Patient Denies nocturnal pain.  Difficulty dressing/grooming: Denies Difficulty climbing stairs: Denies Difficulty getting out of chair: Denies Difficulty using hands for taps, buttons, cutlery, and/or writing: Denies   Review of Systems  Constitutional: Negative for fatigue, night sweats and weakness ( ).  HENT: Negative for mouth sores, mouth dryness and nose dryness.   Eyes: Negative for redness and dryness.  Respiratory: Negative for shortness of breath and difficulty breathing.   Cardiovascular: Negative for chest pain, palpitations, hypertension, irregular heartbeat and swelling in legs/feet.  Gastrointestinal: Negative for constipation and diarrhea.  Endocrine: Negative for increased urination.  Musculoskeletal: Negative for arthralgias, joint pain, joint swelling, myalgias, muscle weakness, morning stiffness, muscle tenderness and myalgias.  Skin: Negative for color change, rash, hair loss, nodules/bumps, skin tightness, ulcers and sensitivity to sunlight.  Allergic/Immunologic: Negative for susceptible to infections.  Neurological: Negative for dizziness, fainting, memory loss and night sweats.  Hematological: Negative for swollen glands.  Psychiatric/Behavioral: Negative for depressed mood and sleep disturbance. The patient is not  nervous/anxious.     PMFS History:  Patient Active Problem List   Diagnosis Date Noted  . Vitamin D deficiency 12/17/2016  . High risk medication use 12/16/2016  . Uveitis 12/16/2016  . Sarcoidosis 10/18/2014    Past Medical History:  Diagnosis Date  . Abnormal chest x-ray 3 weeks ago  . Sarcoidosis     Family History  Problem Relation Age of Onset  . Diabetes Mother   . Heart attack Father    Past Surgical History:  Procedure Laterality Date  . CATARACT EXTRACTION Left   . ENDOBRONCHIAL ULTRASOUND Bilateral 11/14/2014   Procedure: ENDOBRONCHIAL ULTRASOUND;  Surgeon: Barbaraann Share, MD;  Location: [facility];  Service: Endoscopy;  Laterality: Bilateral;  . INGUINAL HERNIA REPAIR Bilateral 04/04/2016   Procedure: LAPAROSCOPIC BILATERAL INGUINAL HERNIA REPAIR;  Surgeon: Avel Peace, MD;  Location: [facility];  Service: General;  Laterality: Bilateral;  . INSERTION OF MESH Bilateral 04/04/2016   Procedure: INSERTION OF MESH;  Surgeon: Avel Peace, MD;  Location: [facility];  Service: General;  Laterality: Bilateral;  . REFRACTIVE SURGERY  2002  . TONSILLECTOMY  age 45   Social History   Social History Narrative  . No narrative on file     Objective: Vital Signs: BP (!) 89/40 (BP Location: Left Arm, Patient Position: Sitting, Cuff Size: Normal)   Pulse (!) 47   Ht  (1.854 m)   Wt 185 lb (83.9 kg)   BMI 24.41 kg/m    Physical Exam  Constitutional: He is oriented to person, place, and time. He appears well-developed and well-nourished.  HENT:  Head: Normocephalic and atraumatic.  Eyes: Pupils are equal, round, and  reactive to light. Conjunctivae and EOM are normal.  Neck: Normal range of motion. Neck supple.  Cardiovascular: Normal rate, regular rhythm and normal heart sounds.   Pulmonary/Chest: Effort normal and breath sounds normal.  Abdominal: Soft. Bowel sounds are normal.  Neurological: He is alert and oriented to person, place, and time.  Skin: Skin is  warm and dry. Capillary refill takes less than 2 seconds.  Psychiatric: He has a normal mood and affect. His behavior is normal.  Nursing note and vitals reviewed.    Musculoskeletal Exam: C-spine and thoracic lumbar spine good range of motion. Shoulder joints elbow joints wrist joint MCPs PIPs DIPs with good range of motion. Hip joints knee joints ankles MTPs PIPs DIPs with good range of motion with no synovitis. CDAI Exam: No CDAI exam completed.    Investigation: No additional findings. CBC Latest Ref Rng & Units 12/23/2016 07/25/2016 03/29/2016  WBC 3.8 - 10.8 K/uL 6.7 5.1 6.8  Hemoglobin 13.2 - 17.1 g/dL 16.1 09.6 04.5  Hematocrit 38.5 - 50.0 % 41.5 40.6 38.1(L)  Platelets 140 - 400 K/uL 193 235 202   CMP Latest Ref Rng & Units 12/23/2016 07/25/2016 03/29/2016  Glucose 65 - 99 mg/dL 409(W) 98 97  BUN 7 - 25 mg/dL 22 18 11(B)  Creatinine 0.60 - 1.35 mg/dL 1.47 8.29 5.62  Sodium 135 - 146 mmol/L 141 138 138  Potassium 3.5 - 5.3 mmol/L 4.9 4.4 4.4  Chloride 98 - 110 mmol/L 103 104 103  CO2 20 - 31 mmol/L Calcium 8.6 - 10.3 mg/dL 9.6 9.4 9.5  Total Protein 6.1 - 8.1 g/dL 6.9 6.9 -  Total Bilirubin 0.2 - 1.2 mg/dL 0.6 0.4 -  Alkaline Phos 40 - 115 U/L 65 58 -  AST 10 - 40 U/L 20 21 -  ALT 9 - 46 U/L 22 18 -    06/10/2017 vitamin D 37.7, CBC normal, CMP normal Imaging: No results found.  Speciality Comments: No specialty comments available.    Procedures:  No procedures performed Allergies: Bee venom   Assessment / Plan:     Visit Diagnoses: Uveitis: Patient has not had a flare since the last visit. He had no conjunctival injection. He is been seeing Dr. Allyne Gee every 6 months. I've advised him to coordinate his next appointment when he comes to see Dr. Allyne Gee. He was interested in tapering the dose of methotrexate. I would like to see him symptom-free for 2 years. I would like Dr. Kellie Moor input regarding this as well.  Sarcoidosis: He had no evidence of  inflammatory arthritis. Patient reports having PFTs done 2 months ago which was stable.  High risk medication use - Rasuvo 25 mg subcutaneous every week and folic acid 2 mg by mouth daily.his labs have been stable. We'll continue to monitor his labs every 3 months.  Vitamin D deficiency : Better on vitamin D supplement.   Orders: No orders of the defined types were placed in this encounter.  No orders of the defined types were placed in this encounter.    Follow-Up Instructions: Return in about 6 months (around 12/22/2017) for Sarcoidosis, Uveitis.   Pollyann Savoy, MD  Note - This record has been created using Animal nutritionist.  Chart creation errors have been sought, but may not always  have been located. Such creation errors do not reflect on  the standard of medical care.

## 2017-06-23 ENCOUNTER — Encounter: Payer: Self-pay | Admitting: Rheumatology

## 2017-06-23 ENCOUNTER — Ambulatory Visit (INDEPENDENT_AMBULATORY_CARE_PROVIDER_SITE_OTHER): Payer: BLUE CROSS/BLUE SHIELD | Admitting: *Deleted

## 2017-06-23 VITALS — BP 89/40 | HR 47 | Ht 73.0 in | Wt 185.0 lb

## 2017-06-23 DIAGNOSIS — Z79899 Other long term (current) drug therapy: Secondary | ICD-10-CM

## 2017-06-23 DIAGNOSIS — H209 Unspecified iridocyclitis: Secondary | ICD-10-CM | POA: Diagnosis not present

## 2017-06-23 DIAGNOSIS — E559 Vitamin D deficiency, unspecified: Secondary | ICD-10-CM | POA: Diagnosis not present

## 2017-06-23 DIAGNOSIS — D869 Sarcoidosis, unspecified: Secondary | ICD-10-CM | POA: Diagnosis not present

## 2017-06-23 NOTE — Patient Instructions (Signed)
Standing Labs We placed an order today for your standing lab work.    Please come back and get your standing labs in January and every 3 months  We have open lab Monday through Friday from 8:30-11:30 AM and 1:30-4 PM at the office of Dr. Zaynab Chipman.   The office is located at [address], [address], [place],  [address] No appointment is necessary.   Labs are drawn by [facility].  You may receive a bill from [facility] for your lab work. If you have any questions regarding directions or hours of operation,  please call [phone].    

## 2017-06-26 ENCOUNTER — Other Ambulatory Visit: Payer: Self-pay | Admitting: Rheumatology

## 2017-06-26 DIAGNOSIS — D869 Sarcoidosis, unspecified: Secondary | ICD-10-CM

## 2017-06-26 NOTE — Telephone Encounter (Signed)
Last Visit: 06/23/17 Next Visit: 12/22/17 Labs: 06/10/17 WNL  Okay to refill per Dr. Corliss Skainseveshwar

## 2017-08-24 ENCOUNTER — Other Ambulatory Visit: Payer: Self-pay | Admitting: Rheumatology

## 2017-08-25 NOTE — Telephone Encounter (Signed)
Last Visit: 06/23/17 Next Visit: 12/22/17  Okay to refill per Dr. Corliss Skainseveshwar

## 2017-10-07 ENCOUNTER — Other Ambulatory Visit: Payer: Self-pay | Admitting: Rheumatology

## 2017-10-07 ENCOUNTER — Telehealth: Payer: Self-pay | Admitting: Rheumatology

## 2017-10-07 DIAGNOSIS — D869 Sarcoidosis, unspecified: Secondary | ICD-10-CM

## 2017-10-07 DIAGNOSIS — Z79899 Other long term (current) drug therapy: Secondary | ICD-10-CM

## 2017-10-07 NOTE — Telephone Encounter (Signed)
Lab released and faxed. 

## 2017-10-07 NOTE — Telephone Encounter (Addendum)
Last Visit: 06/23/17 Next Visit: 12/22/17 Labs: 06/10/17 WNL  Left message to advise patient he is due to update labs.   Okay to refill 30 day supply per Dr. Corliss Skainseveshwar

## 2017-10-07 NOTE — Telephone Encounter (Signed)
Patient at Dr. Garnette GunnerBarkers office [place] now. Request lab orders to be faxed over. Fax# [phone]

## 2017-10-17 ENCOUNTER — Other Ambulatory Visit: Payer: Self-pay | Admitting: Rheumatology

## 2017-10-17 NOTE — Telephone Encounter (Signed)
Last Visit: 06/23/17 Next Visit: 12/22/17  Okay to refill per Dr. Deveshwar 

## 2017-10-23 ENCOUNTER — Other Ambulatory Visit: Payer: Self-pay | Admitting: Rheumatology

## 2017-10-23 DIAGNOSIS — D869 Sarcoidosis, unspecified: Secondary | ICD-10-CM

## 2017-10-24 NOTE — Telephone Encounter (Addendum)
Last Visit: 06/23/17 Next Visit: 12/22/17 Labs: 10/14/17 WNL  Okay to refill per Dr. Corliss Skainseveshwar

## 2017-12-03 ENCOUNTER — Other Ambulatory Visit: Payer: Self-pay | Admitting: Rheumatology

## 2017-12-03 NOTE — Telephone Encounter (Signed)
Last Visit: 06/23/17 Next Visit: 12/22/17  Okay to refill per Dr. Deveshwar 

## 2017-12-17 NOTE — Progress Notes (Signed)
Office Visit Note  Patient: Alexander Nelson             Date of Birth: 24-May-1966           MRN: [id number]             PCP: Brynda Peon, MD Referring: Brynda Peon, MD Visit Date: 12/31/2017 Occupation: @GUAROCC @    Subjective:  Medication monitoring    History of Present Illness: Kelin Borum is a 52 y.o. male with history of sarcoidosis and uveitis.  Patient states he continues to inject Rasuvo 25 mg / 0.5 mL once a week.  He would like to start tapering his dose of Rasuvo.  He denies any recent flares of uveitis.  He continues to have chronic eye dryness.  He follows up with his ophthalmologist every 6 months. He denies any flares of Sarcoidosis.  He denies any shortness of breath, coughing, or pulmonary symptoms.  He denies any swollen lymph nodes.  He denies any joint pain or joint swelling.  He denies any joint stiffness.    Activities of Daily Living:  Patient reports morning stiffness for 0 minutes.   Patient Denies nocturnal pain.  Difficulty dressing/grooming: Denies Difficulty climbing stairs: Denies Difficulty getting out of chair: Denies Difficulty using hands for taps, buttons, cutlery, and/or writing: Denies   Review of Systems  Constitutional: Negative for fatigue and night sweats.  HENT: Negative for mouth sores, trouble swallowing, trouble swallowing, mouth dryness and nose dryness.   Eyes: Positive for dryness. Negative for redness.  Respiratory: Negative for shortness of breath and difficulty breathing.   Cardiovascular: Negative for chest pain, palpitations, hypertension, irregular heartbeat and swelling in legs/feet.  Gastrointestinal: Negative for abdominal pain, constipation and diarrhea.  Endocrine: Negative for increased urination.  Genitourinary: Negative for painful urination and pelvic pain.  Musculoskeletal: Negative for arthralgias, joint pain, joint swelling, myalgias, muscle weakness, morning stiffness, muscle tenderness and myalgias.   Skin: Negative for color change, rash, hair loss, nodules/bumps, skin tightness, ulcers and sensitivity to sunlight.  Allergic/Immunologic: Negative for susceptible to infections.  Neurological: Negative for dizziness, fainting, headaches, memory loss, night sweats and weakness.  Hematological: Negative for bruising/bleeding tendency and swollen glands.  Psychiatric/Behavioral: Negative for depressed mood, confusion and sleep disturbance. The patient is not nervous/anxious.     PMFS History:  Patient Active Problem List   Diagnosis Date Noted  . Vitamin D deficiency 12/17/2016  . High risk medication use 12/16/2016  . Uveitis 12/16/2016  . Sarcoidosis 10/18/2014    Past Medical History:  Diagnosis Date  . Abnormal chest x-ray 3 weeks ago  . Sarcoidosis     Family History  Problem Relation Age of Onset  . Diabetes Mother   . Heart attack Father   . Kidney disease Brother   . Healthy Daughter   . Healthy Daughter   . Healthy Daughter    Past Surgical History:  Procedure Laterality Date  . CATARACT EXTRACTION Left   . ENDOBRONCHIAL ULTRASOUND Bilateral 11/14/2014   Procedure: ENDOBRONCHIAL ULTRASOUND;  Surgeon: Barbaraann Share, MD;  Location: [facility];  Service: Endoscopy;  Laterality: Bilateral;  . INGUINAL HERNIA REPAIR Bilateral 04/04/2016   Procedure: LAPAROSCOPIC BILATERAL INGUINAL HERNIA REPAIR;  Surgeon: Avel Peace, MD;  Location: [facility];  Service: General;  Laterality: Bilateral;  . INSERTION OF MESH Bilateral 04/04/2016   Procedure: INSERTION OF MESH;  Surgeon: Avel Peace, MD;  Location: [facility];  Service: General;  Laterality: Bilateral;  . REFRACTIVE SURGERY  2002  .  TONSILLECTOMY  age 61   Social History   Social History Narrative  . Not on file     Objective: Vital Signs: BP 100/66 (BP Location: Left Arm, Patient Position: Sitting, Cuff Size: Normal)   Pulse (!) 54   Resp 15   Ht 6\' 1"  (1.854 m)   Wt 190 lb (86.2 kg)   BMI 25.07 kg/m     Physical Exam  Constitutional: He is oriented to person, place, and time. He appears well-developed and well-nourished.  HENT:  Head: Normocephalic and atraumatic.  Eyes: Pupils are equal, round, and reactive to light. Conjunctivae and EOM are normal.  Neck: Normal range of motion. Neck supple.  Cardiovascular: Normal rate, regular rhythm and normal heart sounds.  Pulmonary/Chest: Effort normal and breath sounds normal.  Abdominal: Soft. Bowel sounds are normal.  Lymphadenopathy:    He has no cervical adenopathy.  Neurological: He is alert and oriented to person, place, and time.  Skin: Skin is warm and dry. Capillary refill takes less than 2 seconds.  Psychiatric: He has a normal mood and affect. His behavior is normal.  Nursing note and vitals reviewed.    Musculoskeletal Exam: C-spine, thoracic spine, lumbar spine good range of motion.  No midline spinal tenderness.  No SI joint tenderness.  Shoulder joints, elbow joints, wrist joints, MCPs, PIPs, DIPs good range of motion with no synovitis.  Hip joints, joints, ankle joints, MCPs, PIPs, DIPs good range of motion no synovitis.  No warmth or effusion of bilateral knees.  No tenderness of trochanteric bursa.  CDAI Exam: No CDAI exam completed.    Investigation: No additional findings. CBC Latest Ref Rng & Units 12/23/2016 07/25/2016 03/29/2016  WBC 3.8 - 10.8 K/uL 6.7 5.1 6.8  Hemoglobin 13.2 - 17.1 g/dL 16.1 09.6 04.5  Hematocrit 38.5 - 50.0 % 41.5 40.6 38.1(L)  Platelets 140 - 400 K/uL 193 235 202   CMP Latest Ref Rng & Units 12/23/2016 07/25/2016 03/29/2016  Glucose 65 - 99 mg/dL 409(W) 98 97  BUN 7 - 25 mg/dL 22 18 11(B)  Creatinine 0.60 - 1.35 mg/dL 1.47 8.29 5.62  Sodium 135 - 146 mmol/L 141 138 138  Potassium 3.5 - 5.3 mmol/L 4.9 4.4 4.4  Chloride 98 - 110 mmol/L 103 104 103  CO2 20 - 31 mmol/L 26 28 29   Calcium 8.6 - 10.3 mg/dL 9.6 9.4 9.5  Total Protein 6.1 - 8.1 g/dL 6.9 6.9 -  Total Bilirubin 0.2 - 1.2 mg/dL 0.6  0.4 -  Alkaline Phos 40 - 115 U/L 65 58 -  AST 10 - 40 U/L 20 21 -  ALT 9 - 46 U/L 22 18 -    Imaging: No results found.  Speciality Comments: No specialty comments available.    Procedures:  No procedures performed Allergies: Bee venom   Assessment / Plan:     Visit Diagnoses: Sarcoidosis: No recent flares.  No shortness of breath, coughing, or pulmonary symptoms.  He has no joint pain or joint swelling.  He has no synovitis on exam.  He has no palpable cervical lymphadenopathy.  His last PFT was on 04/11/2017 and results were within normal limits.  Uveitis: He has no evidence of conjunctival injection.  He has not had any recent flares.  He continues to see Dr. Allyne Gee every 6 months.  According to his records, he will be symptom free for 2 years in June 2019.  We will decrease his dose of Rasuvo from 25 mg once weekly to 20  mg once weekly.  New prescription of receive a 20 mg once weekly will be sent to the pharmacy.  At his next follow up visit we will discuss further tapering his dose if he does not have any flares.  He was advised to continue to follow up with Dr. Allyne GeeSanders every 6 months.    High risk medication use - Rasuvo, folic acid 2 mg daily.  CBC and CMP will be drawn today to monitor for drug toxicity.- Plan: CBC with Differential/Platelet, COMPLETE METABOLIC PANEL WITH GFR  Vitamin D deficiency -he takes vitamin D 2000 units daily.   Orders: Orders Placed This Encounter  Procedures  . CBC with Differential/Platelet  . COMPLETE METABOLIC PANEL WITH GFR   No orders of the defined types were placed in this encounter.   Follow-Up Instructions: Return for Sarcoidosis, Uveitis .   Gearldine Bienenstockaylor M Dale, PA-C  Note - This record has been created using Dragon software.  Chart creation errors have been sought, but may not always  have been located. Such creation errors do not reflect on  the standard of medical care.

## 2017-12-22 ENCOUNTER — Ambulatory Visit: Payer: BLUE CROSS/BLUE SHIELD | Admitting: Rheumatology

## 2017-12-31 ENCOUNTER — Encounter: Payer: Self-pay | Admitting: Physician Assistant

## 2017-12-31 ENCOUNTER — Ambulatory Visit (INDEPENDENT_AMBULATORY_CARE_PROVIDER_SITE_OTHER): Payer: BLUE CROSS/BLUE SHIELD | Admitting: Physician Assistant

## 2017-12-31 VITALS — BP 100/66 | HR 54 | Resp 15 | Ht 73.0 in | Wt 190.0 lb

## 2017-12-31 DIAGNOSIS — Z79899 Other long term (current) drug therapy: Secondary | ICD-10-CM

## 2017-12-31 DIAGNOSIS — E559 Vitamin D deficiency, unspecified: Secondary | ICD-10-CM

## 2017-12-31 DIAGNOSIS — H209 Unspecified iridocyclitis: Secondary | ICD-10-CM

## 2017-12-31 DIAGNOSIS — D869 Sarcoidosis, unspecified: Secondary | ICD-10-CM

## 2017-12-31 LAB — CBC WITH DIFFERENTIAL/PLATELET
Basophils Absolute: 37 cells/uL (ref 0–200)
Basophils Relative: 0.5 %
Eosinophils Absolute: 89 cells/uL (ref 15–500)
Eosinophils Relative: 1.2 %
HCT: 39.3 % (ref 38.5–50.0)
Hemoglobin: 14 g/dL (ref 13.2–17.1)
Lymphs Abs: 1295 cells/uL (ref 850–3900)
MCH: 31.1 pg (ref 27.0–33.0)
MCHC: 35.6 g/dL (ref 32.0–36.0)
MCV: 87.3 fL (ref 80.0–100.0)
MPV: 9.7 fL (ref 7.5–12.5)
Monocytes Relative: 8 %
Neutro Abs: 5387 cells/uL (ref 1500–7800)
Neutrophils Relative %: 72.8 %
Platelets: 200 10*3/uL (ref 140–400)
RBC: 4.5 10*6/uL (ref 4.20–5.80)
RDW: 13 % (ref 11.0–15.0)
Total Lymphocyte: 17.5 %
WBC mixed population: 592 cells/uL (ref 200–950)
WBC: 7.4 10*3/uL (ref 3.8–10.8)

## 2017-12-31 LAB — COMPLETE METABOLIC PANEL WITH GFR
AG Ratio: 1.7 (calc) (ref 1.0–2.5)
ALT: 33 U/L (ref 9–46)
AST: 28 U/L (ref 10–35)
Albumin: 4.5 g/dL (ref 3.6–5.1)
Alkaline phosphatase (APISO): 77 U/L (ref 40–115)
BUN: 24 mg/dL (ref 7–25)
CO2: 30 mmol/L (ref 20–32)
Calcium: 9.9 mg/dL (ref 8.6–10.3)
Chloride: 101 mmol/L (ref 98–110)
Creat: 1.16 mg/dL (ref 0.70–1.33)
GFR, Est African American: 85 mL/min/{1.73_m2} (ref >=60)
GFR, Est Non African American: 73 mL/min/{1.73_m2} (ref >=60)
Globulin: 2.7 g/dL (calc) (ref 1.9–3.7)
Glucose, Bld: 93 mg/dL (ref 65–99)
Potassium: 4.5 mmol/L (ref 3.5–5.3)
Sodium: 137 mmol/L (ref 135–146)
Total Bilirubin: 0.5 mg/dL (ref 0.2–1.2)
Total Protein: 7.2 g/dL (ref 6.1–8.1)

## 2017-12-31 MED ORDER — METHOTREXATE (PF) 20 MG/0.4ML ~~LOC~~ SOAJ: 20.0000 mg | SUBCUTANEOUS | 0 refills | Status: DC

## 2018-01-01 NOTE — Progress Notes (Signed)
Labs are WNL.

## 2018-01-17 ENCOUNTER — Other Ambulatory Visit: Payer: Self-pay | Admitting: Rheumatology

## 2018-01-19 NOTE — Telephone Encounter (Signed)
Last Visit: 12/31/17 Next Visit: 06/01/18  Okay to refill per Dr. Corliss Skains

## 2018-01-26 ENCOUNTER — Other Ambulatory Visit: Payer: Self-pay | Admitting: Rheumatology

## 2018-01-26 DIAGNOSIS — D869 Sarcoidosis, unspecified: Secondary | ICD-10-CM

## 2018-01-26 NOTE — Telephone Encounter (Signed)
Last Visit: 12/31/17 Next Visit: 06/01/18 Labs: 12/31/17 WNL  Okay to refill per Dr. Corliss Skains

## 2018-01-28 ENCOUNTER — Telehealth: Payer: Self-pay

## 2018-01-28 NOTE — Telephone Encounter (Signed)
Received a fax from [employer] stating that effective 03/09/2018 the company is expanding it utilization management requirements to include Otrexup an Rasuvo. For pts who meet the criteria to continue a prior authorization will be required.   Pt will require a prior authorization to continue Rasuo after 03/09/2018.   Will send document to scan center.  Alexander Nelson, [place], CPhT 11:11 AM

## 2018-02-10 ENCOUNTER — Telehealth: Payer: Self-pay | Admitting: Rheumatology

## 2018-02-10 NOTE — Telephone Encounter (Signed)
Patient called stating he heard from [facility] who stated he will need prior authorization for his Rasuvo.

## 2018-02-11 NOTE — Telephone Encounter (Signed)
Submitted a prior authorizaiton for Rasuvo to pts insurance. Will update once we have a response.   Called pt to update. Pt voices understanding and denies any questions at this time.  Taite Schoeppner, [place], CPhT 3:48 PM

## 2018-02-12 NOTE — Telephone Encounter (Signed)
[facility] a [facility] via cover my meds stating that no PA is needed for Rasuvo at this time.   Called [employer] to clarify. Spoke with Molly MaduroRobert who states that a pa is not required currently however there will be a formulary changed on 03/09/18. He is unable to tell me what those changes will be.   Called pt to update. Left voice message.   Sahirah Rudell, [place], CPhT 8:50 AM

## 2018-03-10 ENCOUNTER — Other Ambulatory Visit: Payer: Self-pay | Admitting: Rheumatology

## 2018-03-11 NOTE — Telephone Encounter (Signed)
Last Visit: 12/31/17 Next Visit: 06/01/18 Labs: 12/31/17 WNL  Okay to refill per Dr. Corliss Skainseveshwar

## 2018-04-01 ENCOUNTER — Telehealth: Payer: Self-pay | Admitting: Pulmonary Disease

## 2018-04-01 NOTE — Telephone Encounter (Signed)
Spoke with patient. He was checking on the status of his CT report from 2018. He had a copy of the report sent to [facility] and claims he never heard anything back in regards of the results. CT report has been scanned into his chart.   [facility], please advise. Thanks!

## 2018-04-03 NOTE — Telephone Encounter (Signed)
I have left message for patient to call back-need to confirm patient wanted recs from [facility] for 2018 Ct Scan and not one recently done. Thanks.

## 2018-04-03 NOTE — Telephone Encounter (Signed)
[facility] printed and [facility] to review and advise. Thanks.

## 2018-04-03 NOTE — Telephone Encounter (Signed)
States the last one he had here was when he saw Dr. Shelle Ironlance, the one his PCP sent us was from 2018, but he does not know if there was ever a comparison done between the two.  He states this is what his PCP is trying to figure out.  CB is [phone].

## 2018-04-03 NOTE — Telephone Encounter (Signed)
That scan was from a year ago.  Has there been something more recent?

## 2018-04-03 NOTE — Telephone Encounter (Signed)
I am looking in the chart and all I see is something from last year.  Please print if available.

## 2018-04-03 NOTE — Telephone Encounter (Signed)
BQ, please advise. Thanks!  

## 2018-04-03 NOTE — Telephone Encounter (Signed)
Received a Prior Authorization request from [facility] for Rasuvo. Authorization has been submitted to patient's insurance via Cover My Meds. Will update once we receive a response.  3:58 PM Dorthula Nettlesachael N Corrisa Gibby, CPhT

## 2018-04-07 MED ORDER — METHOTREXATE (PF) 20 MG/0.4ML ~~LOC~~ SOAJ: 20.0000 mg | SUBCUTANEOUS | 0 refills | Status: DC

## 2018-04-07 NOTE — Telephone Encounter (Signed)
Received a fax regarding Prior Authorization for Rasuvo. Authorization has been DENIED because plan stipulates Otrexup must be tried and failed 1st, and Otrexup will also require a prior authorization. Or an appeal can be submitted.  Will send document to scan center.  Phone# [phone]   12:14 PM Dorthula Nettlesachael N Halle Davlin, CPhT

## 2018-04-07 NOTE — Telephone Encounter (Signed)
Okay to refill OTREXUP

## 2018-04-07 NOTE — Telephone Encounter (Signed)
Called patient to advise that Rasuvo PA was denied. He had no questions or concerns at this time. Will follow up once next steps are decided.  12:24 PM [name], CPhT

## 2018-04-07 NOTE — Telephone Encounter (Signed)
Patient's insurance requires him to try and fail Oxtrexup before they will approve Rasuvo. Can we change prescription?

## 2018-04-07 NOTE — Telephone Encounter (Signed)
Called and spoke with patient regarding CT scan from 2018  Pt advised that his PCP Dr. Allene Pyoimothy Barker MD from [facility] in [place] is requesting copy of 2017 & 2018 CT on Chest to compare one another for the patient.  Pt is requesting both old scans to be sent over to Dr. Karmen StabsBarker's office, nothing BQ needs to do at this time.  Called and spoke with Gunnar Fusiaula with [facility] at phone [phone] Provided her with above info on pt's request; she will have nurse call me back later this week. Will f/u later in the week.

## 2018-04-07 NOTE — Addendum Note (Signed)
Addended by: Henriette CombsHATTON, Micai Apolinar L on: 04/07/2018 02:48 PM   Modules accepted: Orders

## 2018-04-07 NOTE — Telephone Encounter (Signed)
Prescription sent to the pharmacy.

## 2018-04-08 NOTE — Telephone Encounter (Signed)
OK 

## 2018-04-08 NOTE — Telephone Encounter (Signed)
LMV for Marchelle Folksmanda [phone] from Dr. Dewaine CongerBarker office to return my call.

## 2018-04-08 NOTE — Telephone Encounter (Signed)
Marchelle Folksmanda [phone] from Dr. Dewaine CongerBarker office returned phone call

## 2018-04-08 NOTE — Progress Notes (Signed)
Labs are WNL.

## 2018-04-09 NOTE — Telephone Encounter (Signed)
Marchelle Folksmanda is calling back [phone]

## 2018-04-09 NOTE — Telephone Encounter (Signed)
lmtcb for [facility]

## 2018-04-09 NOTE — Telephone Encounter (Signed)
LVM for [facility] with [facility] at phone [phone]

## 2018-04-10 NOTE — Telephone Encounter (Signed)
Called and spoke with Marchelle FolksAmanda with Dr. Ezzard Standingimothy Barker's Office- Pt's PCP She advised pt had Cardiac Calcuim score test 6 weeks ago Pt was provided copy of CT of Arteries on disc at Dr. Karmen StabsBarker's office to bring to BQ for comparison 2018 CT chest scan. Dr. Dewaine CongerBarker found lung nodule on this scan from 6wks ago, and would like BQ to look at this scan  LVM for pt today to see if pt can bring by the disc with Cardiac Calcium test by BQ office  Marchelle Folksmanda is mailing copy of the disc to BQ next week for review Routing message to Merrilee SeashoreBettie Jo for review.

## 2018-04-10 NOTE — Telephone Encounter (Signed)
Pt is calling back [phone] 

## 2018-04-10 NOTE — Telephone Encounter (Signed)
Called and spoke with patient he states that he will bring disc by the office next week. Nothing further needed. Will route to bettie jo to follow up on

## 2018-04-15 ENCOUNTER — Telehealth: Payer: Self-pay | Admitting: Rheumatology

## 2018-04-15 NOTE — Telephone Encounter (Signed)
Patient left a voicemail stating his insurance company rejected the prescription of Rasuvo and want to switch him to some other generic medication.  Patient states he is almost out of his medication and is requesting a return call to discuss his options.

## 2018-04-15 NOTE — Telephone Encounter (Signed)
Patient advised that we have received that information as well. Patient advised we have sent in a prescription for Otrexup to the pharmacy. Patient states he will contact the pharmacy.

## 2018-04-17 ENCOUNTER — Telehealth: Payer: Self-pay | Admitting: Rheumatology

## 2018-04-17 NOTE — Telephone Encounter (Signed)
Patient called stating he still has not been able to get his medication for [facility]. Please advise.

## 2018-04-17 NOTE — Telephone Encounter (Signed)
Called [facility], Otrexup on hold due to PA required.  Received a Prior Authorization request from [facility] for Otrexup. Authorization has been submitted to patient's insurance via Cover My Meds. Will update once we receive a response. Called patient and advised.   3:46 PM Dorthula Nettlesachael N Wetona Viramontes, CPhT

## 2018-04-17 NOTE — Telephone Encounter (Signed)
Patient states he has an injection for today but it is his last injection. Patient would like to know what the delay is. I advised patient that Rachael (patient advocate) is calling the pharmacy.

## 2018-04-23 ENCOUNTER — Telehealth: Payer: Self-pay | Admitting: Rheumatology

## 2018-04-23 NOTE — Telephone Encounter (Signed)
Patient called checking on the status of his medication that needs a prior authorization.  Patient requested a return call.

## 2018-04-23 NOTE — Telephone Encounter (Signed)
There is a disc in mailing envelope on Dr. Ulyses JarredMcQuaid's desk. I assume it is this patient's however there is no return address. Dr. Kendrick FriesMcQuaid is back in the office next week.

## 2018-04-23 NOTE — Telephone Encounter (Signed)
Received a fax regarding Prior Authorization for Otrexup. Authorization has been DENIED because patient has not tried and failed generic methotrexate or has an intolerance.   Patient has been on prefilled medication for a while, [employer] will file an appeal.  Will send document to scan center.  11:51 AM Dorthula Nettlesachael N Novice Vrba, CPhT

## 2018-04-23 NOTE — Telephone Encounter (Signed)
Left message for patient returning his call. Based on his Insurance plan's new changes. His insurance denied PA for Otrexup, they want him to try generic MTX first. Since patient has been on prefilled MTX for an extended period of time, [employer] will submit an appeal. Need to see if he is currently out of medication.  4:27 PM Dorthula Nettlesachael N Okema Rollinson, CPhT

## 2018-04-24 NOTE — Telephone Encounter (Signed)
Spoke to patient, and advised him of pending appeal for Otrexup and possible next steps. He is currently out of medication and will stop by today and or early next week to pick up sample. Labeled and located in medication cabinet.  9:12 AM Dorthula Nettlesachael N Carrell Palmatier, CPhT

## 2018-04-28 ENCOUNTER — Telehealth: Payer: Self-pay | Admitting: Pulmonary Disease

## 2018-04-28 ENCOUNTER — Telehealth: Payer: Self-pay | Admitting: Rheumatology

## 2018-04-28 NOTE — Telephone Encounter (Signed)
There are multiple phone notes indicating that I need to review a CT scan of his chest from 2019.  However I have only been sent CT scan reports images on CD from the scan performed on 03/07/2017.  Can someone please call the patient to find out if he really did have a CT scan of his chest performed in 2019? If so then please have that one sent for my review.  Thanks.

## 2018-04-28 NOTE — Telephone Encounter (Signed)
Submitted application for patient assistance online, while appeal is pending. Awaiting results. Called patient to advise.  4:16 PM Alexander Nelson, CPhT

## 2018-04-28 NOTE — Telephone Encounter (Signed)
Patient called to check on the status of his appeal for his medication.  Patient requested a return call and also to find out if the sample can be mailed to him.

## 2018-04-28 NOTE — Telephone Encounter (Signed)
Left message for patient that his appeal is currently pending, could take up to 7-10 business days. Also, advised that samples can not be mailed. Sample for patient is in medication cabinet.  11:32 AM Dorthula Nettlesachael N Guenther Dunshee, CPhT

## 2018-04-28 NOTE — Telephone Encounter (Signed)
Will route to Merrilee SeashoreBettie Jo for follow per triage protocol.

## 2018-04-28 NOTE — Telephone Encounter (Signed)
Called and spoke to patient. Patient wanted a comparison done of 2017 and 2018 scans. Patient stated that his PCP had wanted the comparison.  Patient has not had a scan done in 2019.

## 2018-04-28 NOTE — Telephone Encounter (Signed)
OK, that makes sense. I'll take a look this week

## 2018-04-30 ENCOUNTER — Telehealth: Payer: Self-pay | Admitting: Pharmacist

## 2018-04-30 NOTE — Telephone Encounter (Signed)
Called patient to notify that the appeal for Otrexup was approved and he should be able to fill Rx at his pharmacy.  Insurance: [employer] [place] Reference # [id number] Effective Dates: 04/17/2018 through 04/15/2021  Will send approval to scan center.

## 2018-05-07 ENCOUNTER — Telehealth: Payer: Self-pay | Admitting: Rheumatology

## 2018-05-07 NOTE — Telephone Encounter (Signed)
Lupita LeashDonna from [employer] [facility] [employer] left a voicemail checking the status of the appeal with patient's insurance company for the Otrexup.   Please call [phone]

## 2018-05-13 ENCOUNTER — Telehealth: Payer: Self-pay

## 2018-05-13 NOTE — Telephone Encounter (Signed)
Attempted to call patient to relay message from Dr. Kendrick Fries, no answer, left message to call back.

## 2018-05-13 NOTE — Telephone Encounter (Signed)
Pt is aware of results and voiced his understanding. Nothing further is needed.  

## 2018-05-13 NOTE — Telephone Encounter (Signed)
Pt is calling back [phone]

## 2018-05-13 NOTE — Telephone Encounter (Signed)
-----   Message from Lupita Leash, MD sent at 05/13/2018  1:25 PM EDT ----- Hi, Please let him know that I reviewed the images from his 2018 CT chest and compared it to the images from the 2016 CT chest.  The nodules are stable and don't need a repeat scan. Apologies for the delay in reseponse. [name]

## 2018-05-14 NOTE — Progress Notes (Signed)
Office Visit Note  Patient: Alexander Nelson             Date of Birth: 1965/11/20           MRN: [id number]             PCP: Brynda Peon, MD Referring: Brynda Peon, MD Visit Date: 05/27/2018 Occupation: @GUAROCC @  Subjective:  Medication monitoring   History of Present Illness: Alexander Nelson is a 52 y.o. male with history of sarcoidosis and uveitis.  Patient is on Rasuvo 20 mg sq once a week and folic acid 2 mg po daily.  He reports at his last visit with Dr. Allyne Gee, he was comfortable with tapering his dose of Rasuvo further.  He denies any flares since reducing his dose of Rasuvo from 25 mg subcu once a week to 20 mg subcu once a week.  He would like to further taper his dose.  He denies any new or worsening pulmonary symptoms.  He denies any coughing or shortness of breath.  He denies any swollen lymph nodes at this time.  He denies any eye redness or eye inflammation at this time.   Activities of Daily Living:  Patient reports morning stiffness for 0 minutes.   Patient Denies nocturnal pain.  Difficulty dressing/grooming: Denies Difficulty climbing stairs: Denies Difficulty getting out of chair: Denies Difficulty using hands for taps, buttons, cutlery, and/or writing: Denies  Review of Systems  Constitutional: Negative for fatigue and night sweats.  HENT: Negative for mouth sores, trouble swallowing, trouble swallowing, mouth dryness and nose dryness.   Eyes: Negative for redness, visual disturbance and dryness.  Respiratory: Negative for cough, hemoptysis, shortness of breath and difficulty breathing.   Cardiovascular: Negative for chest pain, palpitations, hypertension, irregular heartbeat and swelling in legs/feet.  Gastrointestinal: Negative for blood in stool, constipation and diarrhea.  Endocrine: Negative for increased urination.  Genitourinary: Negative for painful urination.  Musculoskeletal: Negative for arthralgias, joint pain, joint swelling, myalgias,  muscle weakness, morning stiffness, muscle tenderness and myalgias.  Skin: Negative for color change, rash, hair loss, nodules/bumps, skin tightness, ulcers and sensitivity to sunlight.  Allergic/Immunologic: Negative for susceptible to infections.  Neurological: Negative for dizziness, fainting, memory loss and night sweats.  Hematological: Negative for swollen glands.  Psychiatric/Behavioral: Negative for depressed mood and sleep disturbance. The patient is not nervous/anxious.     PMFS History:  Patient Active Problem List   Diagnosis Date Noted  . Vitamin D deficiency 12/17/2016  . High risk medication use 12/16/2016  . Uveitis 12/16/2016  . Sarcoidosis 10/18/2014    Past Medical History:  Diagnosis Date  . Abnormal chest x-ray 3 weeks ago  . Sarcoidosis     Family History  Problem Relation Age of Onset  . Diabetes Mother   . Heart attack Father   . Kidney disease Brother   . Healthy Daughter   . Healthy Daughter   . Healthy Daughter    Past Surgical History:  Procedure Laterality Date  . CATARACT EXTRACTION Left   . ENDOBRONCHIAL ULTRASOUND Bilateral 11/14/2014   Procedure: ENDOBRONCHIAL ULTRASOUND;  Surgeon: Barbaraann Share, MD;  Location: [facility];  Service: Endoscopy;  Laterality: Bilateral;  . INGUINAL HERNIA REPAIR Bilateral 04/04/2016   Procedure: LAPAROSCOPIC BILATERAL INGUINAL HERNIA REPAIR;  Surgeon: Avel Peace, MD;  Location: [facility];  Service: General;  Laterality: Bilateral;  . INSERTION OF MESH Bilateral 04/04/2016   Procedure: INSERTION OF MESH;  Surgeon: Avel Peace, MD;  Location: [facility];  Service:  General;  Laterality: Bilateral;  . REFRACTIVE SURGERY  2002  . TONSILLECTOMY  age 10   Social History   Social History Narrative  . Not on file    Objective: Vital Signs: BP (!) 96/59 (BP Location: Left Arm, Patient Position: Sitting, Cuff Size: Small)   Pulse (!) 55   Resp 12   Ht 6\' 1"  (1.854 m)   Wt 196 lb 3.2 oz (89 kg)   BMI 25.89  kg/m    Physical Exam  Constitutional: He is oriented to person, place, and time. He appears well-developed and well-nourished.  HENT:  Head: Normocephalic and atraumatic.  Eyes: Pupils are equal, round, and reactive to light. Conjunctivae and EOM are normal.  Neck: Normal range of motion. Neck supple.  Cardiovascular: Normal rate, regular rhythm and normal heart sounds.  Pulmonary/Chest: Effort normal and breath sounds normal.  Abdominal: Soft. Bowel sounds are normal.  Lymphadenopathy:    He has cervical adenopathy (Submandibular lymph node).  Neurological: He is alert and oriented to person, place, and time.  Skin: Skin is warm and dry. Capillary refill takes less than 2 seconds.  Psychiatric: He has a normal mood and affect. His behavior is normal.  Nursing note and vitals reviewed.    Musculoskeletal Exam: C-spine, thoracic spine, and lumbar spine good ROM.  No midline spinal tenderness.  No SI joint tenderness.  Shoulder joints, elbow joints, wrist joints, MCPs, PIPs, and DIPs good ROM with no synovitis.  He is complete fist formation bilaterally.  Hip joints, knee joints, ankle joints good ROM with no synovitis.  No warmth or effusion of knee joints.  No tenderness of trochanteric bursa bilaterally.    CDAI Exam: CDAI Score: Not documented Patient Global Assessment: Not documented; Provider Global Assessment: Not documented Swollen: Not documented; Tender: Not documented Joint Exam   Not documented   There is currently no information documented on the homunculus. Go to the Rheumatology activity and complete the homunculus joint exam.  Investigation: No additional findings.  Imaging: No results found.  Recent Labs: Lab Results  Component Value Date   WBC 7.4 12/31/2017   HGB 14.0 12/31/2017   PLT 200 12/31/2017   NA 137 12/31/2017   K 4.5 12/31/2017   CL 101 12/31/2017   CO2 30 12/31/2017   GLUCOSE 93 12/31/2017   BUN 24 12/31/2017   CREATININE 1.16 12/31/2017     BILITOT 0.5 12/31/2017   ALKPHOS 65 12/23/2016   AST 28 12/31/2017   ALT 33 12/31/2017   PROT 7.2 12/31/2017   ALBUMIN 4.3 12/23/2016   CALCIUM 9.9 12/31/2017   GFRAA 85 12/31/2017    Speciality Comments: No specialty comments available.  Procedures:  No procedures performed Allergies: Bee venom   Assessment / Plan:     Visit Diagnoses: Sarcoidosis: He has not had any recent new or worsening pulmonary symptoms.  He has no shortness of breath or cough at this time.  He has not had any recent recurrences of sarcoidosis.  He was advised to notify us if he develops any new or worsening symptoms.  He would like to further taper Rasuvo 20 mg subcutaneous injections once weekly to 15 mg sq once weekly and continue taking folic acid 2 mg daily.    He was given 2 samples today in the office.  A refill of folic acid was sent to the pharmacy.   Uveitis -He has not had any recurrences of uveitis since his last visit.  He has not noticed any new  or worsening symptoms since tapering Rasuvo from 25 mg sq once a week to 20 mg sq once a week.  He would like to continue to further taper Rasuvo.  He was advanced to get cleared by Dr. Allyne Gee at his next appointment in October before further tapering.  He was given 2 samples of Rasuvo 15 mg sq injections.    High risk medication use - Rasuvo 20 mg once weekly and folic acid 2 mg.  CBC and CMP were drawn today to monitor for drug toxicity.  He will return in December and every 3 months for lab work to monitor.  He was given two samples of Rasuvo 15 mg subcutaneous injections.- Plan: CBC with Differential/Platelet, COMPLETE METABOLIC PANEL WITH GFR  Vitamin D deficiency   Orders: Orders Placed This Encounter  Procedures  . CBC with Differential/Platelet  . COMPLETE METABOLIC PANEL WITH GFR   Meds ordered this encounter  Medications  . folic acid (FOLVITE) 1 MG tablet    Sig: TAKE 2 TABLETS(2 MG) BY MOUTH DAILY    Dispense:  180 tablet    Refill:  3      Follow-Up Instructions: Return in about 5 months (around 10/27/2018) for Sarcoidosis, Uveitis .   Gearldine Bienenstock, PA-C  I examined and evaluated the patient with Sherron Ales PA.  She had no synovitis on my examination.  He states he has not had any eye inflammation.  Although he had few floaters.  He will see Dr. Allyne Gee next month.  If his eye exam is normal then we will taper his methotrexate further.  Denies any shortness of breath.  The plan of care was discussed as noted above.  Pollyann Savoy, MD Note - This record has been created using Animal nutritionist.  Chart creation errors have been sought, but may not always  have been located. Such creation errors do not reflect on  the standard of medical care.

## 2018-05-27 ENCOUNTER — Ambulatory Visit (INDEPENDENT_AMBULATORY_CARE_PROVIDER_SITE_OTHER): Payer: BLUE CROSS/BLUE SHIELD | Admitting: Rheumatology

## 2018-05-27 ENCOUNTER — Encounter: Payer: Self-pay | Admitting: Physician Assistant

## 2018-05-27 VITALS — BP 96/59 | HR 55 | Resp 12 | Ht 73.0 in | Wt 196.2 lb

## 2018-05-27 DIAGNOSIS — Z79899 Other long term (current) drug therapy: Secondary | ICD-10-CM

## 2018-05-27 DIAGNOSIS — D869 Sarcoidosis, unspecified: Secondary | ICD-10-CM

## 2018-05-27 DIAGNOSIS — H209 Unspecified iridocyclitis: Secondary | ICD-10-CM | POA: Diagnosis not present

## 2018-05-27 DIAGNOSIS — E559 Vitamin D deficiency, unspecified: Secondary | ICD-10-CM | POA: Diagnosis not present

## 2018-05-27 LAB — CBC WITH DIFFERENTIAL/PLATELET
Basophils Absolute: 40 cells/uL (ref 0–200)
Basophils Relative: 0.6 %
Eosinophils Absolute: 141 cells/uL (ref 15–500)
Eosinophils Relative: 2.1 %
HCT: 37.6 % — ABNORMAL LOW (ref 38.5–50.0)
Hemoglobin: 13.1 g/dL — ABNORMAL LOW (ref 13.2–17.1)
Lymphs Abs: 1441 cells/uL (ref 850–3900)
MCH: 30.9 pg (ref 27.0–33.0)
MCHC: 34.8 g/dL (ref 32.0–36.0)
MCV: 88.7 fL (ref 80.0–100.0)
MPV: 9.5 fL (ref 7.5–12.5)
Monocytes Relative: 9 %
Neutro Abs: 4476 cells/uL (ref 1500–7800)
Neutrophils Relative %: 66.8 %
Platelets: 197 10*3/uL (ref 140–400)
RBC: 4.24 10*6/uL (ref 4.20–5.80)
RDW: 12.7 % (ref 11.0–15.0)
Total Lymphocyte: 21.5 %
WBC mixed population: 603 cells/uL (ref 200–950)
WBC: 6.7 10*3/uL (ref 3.8–10.8)

## 2018-05-27 LAB — COMPLETE METABOLIC PANEL WITH GFR
AG Ratio: 1.9 (calc) (ref 1.0–2.5)
ALT: 24 U/L (ref 9–46)
AST: 20 U/L (ref 10–35)
Albumin: 4.4 g/dL (ref 3.6–5.1)
Alkaline phosphatase (APISO): 63 U/L (ref 40–115)
BUN: 21 mg/dL (ref 7–25)
CO2: 24 mmol/L (ref 20–32)
Calcium: 9.8 mg/dL (ref 8.6–10.3)
Chloride: 104 mmol/L (ref 98–110)
Creat: 1.07 mg/dL (ref 0.70–1.33)
GFR, Est African American: 93 mL/min/{1.73_m2} (ref >=60)
GFR, Est Non African American: 80 mL/min/{1.73_m2} (ref >=60)
Globulin: 2.3 g/dL (calc) (ref 1.9–3.7)
Glucose, Bld: 89 mg/dL (ref 65–99)
Potassium: 4.8 mmol/L (ref 3.5–5.3)
Sodium: 136 mmol/L (ref 135–146)
Total Bilirubin: 0.5 mg/dL (ref 0.2–1.2)
Total Protein: 6.7 g/dL (ref 6.1–8.1)

## 2018-05-27 MED ORDER — FOLIC ACID 1 MG PO TABS: ORAL_TABLET | ORAL | 3 refills | Status: DC

## 2018-05-28 NOTE — Progress Notes (Signed)
Hgb and Hct are borderline low.  All other labs are WNL.

## 2018-06-01 ENCOUNTER — Ambulatory Visit: Payer: Self-pay | Admitting: Rheumatology

## 2018-07-02 ENCOUNTER — Telehealth: Payer: Self-pay | Admitting: Rheumatology

## 2018-07-02 ENCOUNTER — Other Ambulatory Visit: Payer: Self-pay | Admitting: Pharmacist

## 2018-07-02 DIAGNOSIS — D869 Sarcoidosis, unspecified: Secondary | ICD-10-CM

## 2018-07-02 MED ORDER — METHOTREXATE (PF) 15 MG/0.4ML ~~LOC~~ SOAJ: 15.0000 mg | SUBCUTANEOUS | 0 refills | Status: DC

## 2018-07-02 NOTE — Progress Notes (Signed)
Dose of methotrexate was decreased at last visit. Patient given samples for 15 mg of Rasuvo at last visit but no prescription was called in. He was receiving Otrexup through [facility].  Verbal order given for Otrexup 15mg   90 day supply with no refills as labs are up to date.

## 2018-07-02 NOTE — Telephone Encounter (Signed)
Pharmacy calling in reference to Otrexip rx. Request rx for 15mg , per patient . Please call to advise.

## 2018-07-13 ENCOUNTER — Other Ambulatory Visit: Payer: Self-pay | Admitting: Rheumatology

## 2018-08-10 ENCOUNTER — Ambulatory Visit (INDEPENDENT_AMBULATORY_CARE_PROVIDER_SITE_OTHER): Payer: BLUE CROSS/BLUE SHIELD | Admitting: Pulmonary Disease

## 2018-08-10 ENCOUNTER — Encounter: Payer: Self-pay | Admitting: Pulmonary Disease

## 2018-08-10 ENCOUNTER — Other Ambulatory Visit: Payer: Self-pay | Admitting: Pulmonary Disease

## 2018-08-10 VITALS — BP 100/60 | HR 63 | Ht 73.03 in | Wt 183.0 lb

## 2018-08-10 DIAGNOSIS — D869 Sarcoidosis, unspecified: Secondary | ICD-10-CM

## 2018-08-10 DIAGNOSIS — R059 Cough, unspecified: Secondary | ICD-10-CM

## 2018-08-10 DIAGNOSIS — R05 Cough: Secondary | ICD-10-CM

## 2018-08-10 DIAGNOSIS — R0982 Postnasal drip: Secondary | ICD-10-CM

## 2018-08-10 LAB — PULMONARY FUNCTION TEST
DL/VA % pred: 83 %
DL/VA: 3.99 ml/min/mmHg/L
DLCO unc % pred: 90 %
DLCO unc: 31.75 ml/min/mmHg
FEF 25-75 Post: 4.75 L/sec
FEF 25-75 Pre: 4.17 L/sec
FEF2575-%Change-Post: 13 %
FEF2575-%Pred-Post: 131 %
FEF2575-%Pred-Pre: 115 %
FEV1-%Change-Post: 2 %
FEV1-%Pred-Post: 117 %
FEV1-%Pred-Pre: 113 %
FEV1-Post: 4.87 L
FEV1-Pre: 4.74 L
FEV1FVC-%Change-Post: 2 %
FEV1FVC-%Pred-Pre: 101 %
FEV6-%Change-Post: 0 %
FEV6-%Pred-Post: 116 %
FEV6-%Pred-Pre: 116 %
FEV6-Post: 6.06 L
FEV6-Pre: 6.03 L
FEV6FVC-%Change-Post: 0 %
FEV6FVC-%Pred-Post: 103 %
FEV6FVC-%Pred-Pre: 103 %
FVC-%Change-Post: 0 %
FVC-%Pred-Post: 112 %
FVC-%Pred-Pre: 112 %
FVC-Post: 6.07 L
FVC-Pre: 6.05 L
Post FEV1/FVC ratio: 80 %
Post FEV6/FVC ratio: 100 %
Pre FEV1/FVC ratio: 78 %
Pre FEV6/FVC Ratio: 100 %
RV % pred: 63 %
RV: 1.37 L
TLC % pred: 98 %
TLC: 7.25 L

## 2018-08-10 NOTE — Progress Notes (Signed)
Subjective:    Patient ID: Alexander Nelson, male    DOB: [birth year], 52 y.o.   MRN: [id number]  Synopsis: Former patient of Dr. Shelle Iron who has a constellation of symptoms that seem consistent with sarcoidosis.The majority of his symptoms have been associated with uveitis.   HPI Chief Complaint  Patient presents with  . Follow-up    coughing -dry   Alexander Nelson continues to have a dry cough, but he has not had problems with shortness of breath, bronchitis, or pneumonia since the last visit.  He says that he has been exercising heavily and he is not limited by his lungs (shortness of breath or chest tightness) and is able to complete a rigorous exercise routine routinely.  He and his rheumatology doctor are tapering down his methotrexate injection slowly.  So far he has not had problems with this.    Past Medical History:  Diagnosis Date  . Abnormal chest x-ray 3 weeks ago  . Sarcoidosis       Review of Systems  Constitutional: Negative for chills, fatigue and fever.  HENT: Positive for postnasal drip and sinus pressure. Negative for sinus pain.   Respiratory: Positive for cough. Negative for shortness of breath and wheezing.   Cardiovascular: Negative for chest pain, palpitations and leg swelling.       Objective:   Physical Exam  Vitals:   08/10/18 1514  BP: 100/60  Pulse: 63  SpO2: 97%  Weight: 183 lb (83 kg)  Height: 6' 1.03" (1.855 m)  RA  Gen: well appearing HENT: OP clear, TM's clear, neck supple PULM: CTA B, normal percussion CV: RRR, no mgr, trace edema GI: BS+, soft, nontender Derm: no cyanosis or rash Psyche: normal mood and affect    PFT: March 2017 pulmonary function testing ratio 83%, FEV1 4.60 L (100% predicted, 7% change with bronchodilator), FVC 5.55 L (94% predicted), total lung capacity 7.44 L (94% predicted), DLCO 32.12 (83% predicted).  Chest imaging: CXR 2016:  Mediastinal LN, no obvious infiltrate. 2018 CT chest images independently reviewed  showing scattered pulmonary nodules which have not changed compared to 2016.  Labs: ACE 2016:  91 HIV 2016: negative CMET/EKG normal 11/2014  Path: EBUS 11/2014:  Lymphocytes seen, rare granuloma-like fragment       Assessment & Plan:  Sarcoidosis  Cough  Post-nasal drip Discussion: This has been a stable interval for Alexander Hua.  He has not had an exacerbation of his sarcoid from a pulmonary standpoint ever.  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  He has a dry cough which is due to laryngeal irritation and not pulmonary problems.  We will check a lung function test today just to be sure.  Plan: Pulmonary sarcoidosis with minimal respiratory symptoms: Repeat lung function testing today Follow-up with me in 1 year Practice good hand hygiene Stay active  Sarcoidosis causing uveitis: Continue tapering off methotrexate as directed by rheumatology  Dry cough: Often due to allergic rhinitis or gastroesophageal reflux disease: If you have postnasal drip I recommend the following: Use Neil Med rinses with distilled water at least twice per day using the instructions on the package. 1/2 hour after using the [facility] Med rinse, use Nasacort two puffs in each nostril once per day.  Remember that the Nasacort can take 1-2 weeks to work after regular use. Use generic zyrtec (cetirizine) every day.  If this doesn't help, then stop taking it and use chlorpheniramine-phenylephrine combination tablets.  We will see you back in 1 year or sooner if needed   Current Outpatient Medications:  .  Cholecalciferol (VITAMIN D3) 5000 units TABS, Take 1 tablet by mouth daily., Disp: , Rfl:  .  EPINEPHrine 0.3 mg/0.3 mL IJ SOAJ injection, Inject into the muscle as needed., Disp: , Rfl: 2 .  folic acid (FOLVITE) 1 MG tablet, TAKE 2 TABLETS(2 MG) BY MOUTH DAILY, Disp: 180 tablet, Rfl: 3 .  Methotrexate, PF, (OTREXUP) 15  MG/0.4ML SOAJ, Inject 15 mg into the skin once a week., Disp: 12 pen, Rfl: 0 .  traZODone (DESYREL) 50 MG tablet, Take 50 mg by mouth at bedtime as needed for sleep., Disp: , Rfl:

## 2018-08-10 NOTE — Progress Notes (Signed)
pft order

## 2018-08-10 NOTE — Progress Notes (Signed)
PFT completed 08/10/18  

## 2018-08-10 NOTE — Patient Instructions (Signed)
Pulmonary sarcoidosis with minimal respiratory symptoms: Repeat lung function testing today Follow-up with me in 1 year Practice good hand hygiene Stay active  Sarcoidosis causing uveitis: Continue tapering off methotrexate as directed by rheumatology  Dry cough: Often due to allergic rhinitis or gastroesophageal reflux disease: If you have postnasal drip I recommend the following: Use Neil Med rinses with distilled water at least twice per day using the instructions on the package. 1/2 hour after using the [facility], use Nasacort two puffs in each nostril once per day.  Remember that the Nasacort can take 1-2 weeks to work after regular use. Use generic zyrtec (cetirizine) every day.  If this doesn't help, then stop taking it and use chlorpheniramine-phenylephrine combination tablets.  We will see you back in 1 year or sooner if needed

## 2018-08-11 ENCOUNTER — Telehealth: Payer: Self-pay

## 2018-08-11 NOTE — Telephone Encounter (Signed)
-----   Message from Lupita Leashouglas B McQuaid, MD sent at 08/11/2018  9:15 AM EST ----- Alexander Nelson,  Please call him to tell him that his PFT was normal.  Thanks, Kipp BroodBrent

## 2018-08-11 NOTE — Telephone Encounter (Signed)
Attempted to call patient to relay results per Dr. Kendrick FriesMcQuaid, no answer, left message to call back.

## 2018-08-17 NOTE — Telephone Encounter (Signed)
Attempted to call patient, no answer, left message to call back.  

## 2018-08-18 NOTE — Telephone Encounter (Signed)
Patient returned call, CB is [phone]

## 2018-08-18 NOTE — Telephone Encounter (Signed)
Called and spoke with patient he is aware and verbalized understanding. Nothing further needed.  

## 2018-10-12 NOTE — Progress Notes (Signed)
Office Visit Note  Patient: Alexander Nelson             Date of Birth: 02/07/1966           MRN: [id number]             PCP: Brynda Peon, MD Referring: Brynda Peon, MD Visit Date: 10/26/2018 Occupation: @GUAROCC @  Subjective:  Medication monitoring    History of Present Illness: Alexander Nelson is a 53 y.o. male with history of sarcoidosis and uveitis.  He continues to inject Otrexup 15 mg sq once weekly and takes folic acid 2 mg po daily.  He reports that he was due for his last injection yesterday but has had some symptoms of an upper respiratory tract infection so did not inject.  Reports that he is feeling better today though.  He denies any fevers.  He denies any coughing or new pulmonary symptoms.  He denies any swollen lymph nodes.  He denies any flares of uveitis recently.  He states that he saw Dr. Allyne Gee about 2 months ago and everything was normal.  He denies any joint pain or joint swelling at this time.  He denies any concerns.  Activities of Daily Living:  Patient reports morning stiffness for0  minutes.   Patient Denies nocturnal pain.  Difficulty dressing/grooming: Denies Difficulty climbing stairs: Denies Difficulty getting out of chair: Denies Difficulty using hands for taps, buttons, cutlery, and/or writing: Denies  Review of Systems  Constitutional: Negative for fatigue and night sweats.  HENT: Negative for mouth sores, mouth dryness and nose dryness.   Eyes: Negative for redness, visual disturbance and dryness.  Respiratory: Negative for cough, hemoptysis, shortness of breath and difficulty breathing.   Cardiovascular: Negative for chest pain, palpitations, hypertension, irregular heartbeat and swelling in legs/feet.  Gastrointestinal: Negative for blood in stool, constipation and diarrhea.  Endocrine: Negative for increased urination.  Genitourinary: Negative for painful urination.  Musculoskeletal: Negative for arthralgias, joint pain, joint  swelling, myalgias, muscle weakness, morning stiffness, muscle tenderness and myalgias.  Skin: Negative for color change, rash, hair loss, nodules/bumps, skin tightness, ulcers and sensitivity to sunlight.  Allergic/Immunologic: Negative for susceptible to infections.  Neurological: Negative for dizziness, fainting, memory loss, night sweats and weakness.  Hematological: Negative for swollen glands.  Psychiatric/Behavioral: Negative for depressed mood and sleep disturbance. The patient is not nervous/anxious.     PMFS History:  Patient Active Problem List   Diagnosis Date Noted  . Vitamin D deficiency 12/17/2016  . High risk medication use 12/16/2016  . Uveitis 12/16/2016  . Sarcoidosis 10/18/2014    Past Medical History:  Diagnosis Date  . Abnormal chest x-ray 3 weeks ago  . Sarcoidosis     Family History  Problem Relation Age of Onset  . Diabetes Mother   . Heart attack Father   . Kidney disease Brother   . Healthy Daughter   . Healthy Daughter   . Healthy Daughter    Past Surgical History:  Procedure Laterality Date  . CATARACT EXTRACTION Left   . ENDOBRONCHIAL ULTRASOUND Bilateral 11/14/2014   Procedure: ENDOBRONCHIAL ULTRASOUND;  Surgeon: Barbaraann Share, MD;  Location: [facility];  Service: Endoscopy;  Laterality: Bilateral;  . INGUINAL HERNIA REPAIR Bilateral 04/04/2016   Procedure: LAPAROSCOPIC BILATERAL INGUINAL HERNIA REPAIR;  Surgeon: Avel Peace, MD;  Location: [facility];  Service: General;  Laterality: Bilateral;  . INSERTION OF MESH Bilateral 04/04/2016   Procedure: INSERTION OF MESH;  Surgeon: Avel Peace, MD;  Location: [facility];  Service: General;  Laterality: Bilateral;  . REFRACTIVE SURGERY  2002  . TONSILLECTOMY  age 29   Social History   Social History Narrative  . Not on file   Immunization History  Administered Date(s) Administered  . Influenza Split 06/24/2015  . Influenza,inj,Quad PF,6+ Mos 06/03/2016  . Influenza-Unspecified 07/10/2014       Objective: Vital Signs: BP 108/70 (BP Location: Left Arm, Patient Position: Sitting, Cuff Size: Normal)   Pulse (!) 57   Resp 14   Ht 6\' 1"  (1.854 Nelson)   Wt 180 lb (81.6 kg)   BMI 23.75 kg/Nelson    Physical Exam Vitals signs and nursing note reviewed.  Constitutional:      Appearance: He is well-developed.  HENT:     Head: Normocephalic and atraumatic.  Eyes:     Conjunctiva/sclera: Conjunctivae normal.     Pupils: Pupils are equal, round, and reactive to light.  Neck:     Musculoskeletal: Normal range of motion and neck supple.  Cardiovascular:     Rate and Rhythm: Normal rate and regular rhythm.     Heart sounds: Normal heart sounds.  Pulmonary:     Effort: Pulmonary effort is normal.     Breath sounds: Normal breath sounds.  Abdominal:     General: Bowel sounds are normal.     Palpations: Abdomen is soft.  Lymphadenopathy:     Cervical: No cervical adenopathy.  Skin:    General: Skin is warm and dry.     Capillary Refill: Capillary refill takes less than 2 seconds.  Neurological:     Mental Status: He is alert and oriented to person, place, and time.  Psychiatric:        Behavior: Behavior normal.      Musculoskeletal Exam:C-spine, thoracic spine, and lumbar spine good ROM.  No midline spinal tenderness.  No SI joint tenderness.  Shoulder joints, elbow joints, wrist joints, MCPs, PIPs,and DIPs good ROM with no synovitis.  Mild PIP and DIP synovial thickening.  Complete fist formation bilaterally.  Hip joints, knee joints, ankle joints, MTPs, PIPs, DIPs good range of motion with no synovitis.  No warmth or effusion of knee joints.  No tenderness or swelling of ankle joints. No achilles tendonitis or plantar fasciitis.    CDAI Exam: CDAI Score: Not documented Patient Global Assessment: Not documented; Provider Global Assessment: Not documented Swollen: Not documented; Tender: Not documented Joint Exam   Not documented   There is currently no information documented on  the homunculus. Go to the Rheumatology activity and complete the homunculus joint exam.  Investigation: No additional findings.  Imaging: No results found.  Recent Labs: Lab Results  Component Value Date   WBC 6.7 05/27/2018   HGB 13.1 (L) 05/27/2018   PLT 197 05/27/2018   NA 136 05/27/2018   K 4.8 05/27/2018   CL 104 05/27/2018   CO2 24 05/27/2018   GLUCOSE 89 05/27/2018   BUN 21 05/27/2018   CREATININE 1.07 05/27/2018   BILITOT 0.5 05/27/2018   ALKPHOS 65 12/23/2016   AST 20 05/27/2018   ALT 24 05/27/2018   PROT 6.7 05/27/2018   ALBUMIN 4.3 12/23/2016   CALCIUM 9.8 05/27/2018   GFRAA 93 05/27/2018    Speciality Comments: No specialty comments available.  Procedures:  No procedures performed Allergies: Bee venom   Assessment / Plan:     Visit Diagnoses: Sarcoidosis: He has not had any recent pulmonary sarcoid flares.  He has not had any new or worsening pulmonary  symptoms. He has an intermittent cough due to post-nasal drip. He continues to perform rigorous exercise on a regular basis without difficulty.  Most recent PFT was on 08/10/18 and was within normal limits. He follows up with Dr. Kendrick FriesMcquaid on a yearly basis.  He is clinically doing well on Otrexup 15 mg sq injections once weekly and folic acid 2 mg po daily.  He would like to reduce the dose of Otrexup to 10 mg sq once weekly.  2 samples were provided.  A refill will be sent to the pharmacy.   He was advised to notify us if he develops any new or worsening symptoms.  He will follow up in 5 months.   Uveitis -He has not had any recent flares.  He continues to see Dr. Allyne GeeSanders on a regular basis.  He is asymptomatic at this time. No conjunctival injection noted.  No photophobia or eye dryness. He is clinically doing well on Otrexup 15 mg sq once weekly, but he would like to continue to try to taper off of MTX.  We discussed the risks and benefits.  He would like to proceed with trying Otrexup 10 mg sq injections once  weekly.  He was advised to notify Dr. Allyne GeeSanders and Dr. Corliss Skainseveshwar if he develops any signs of symptoms of a flare.    High risk medication use - Reducing to Otrexup 10 mg once weekly and folic acid 2 mg.  He has clinically been doing well on 15 mg and he would like to continue to try to taper off of Otrexup.  CBC and CMP were drawn today.  He will return in May and every 3 months for lab work.  Standing orders placed today.  - Plan: CBC with Differential/Platelet, COMPLETE METABOLIC PANEL WITH GFR, CBC with Differential/Platelet, COMPLETE METABOLIC PANEL WITH GFR  Vitamin D deficiency: He is taking a vitamin D supplement.   History of cataract extraction   Orders: Orders Placed This Encounter  Procedures  . CBC with Differential/Platelet  . COMPLETE METABOLIC PANEL WITH GFR  . CBC with Differential/Platelet  . COMPLETE METABOLIC PANEL WITH GFR   Meds ordered this encounter  Medications  . Methotrexate, PF, (OTREXUP) 10 MG/0.4ML SOAJ    Sig: Inject 10 mg into the skin once a week.    Dispense:  12 pen    Refill:  0      Follow-Up Instructions: Return in about 5 months (around 03/26/2019) for Sarcoidosis, Uveitis .   Alexander Nelson , PA-C  Note - This record has been created using Dragon software.  Chart creation errors have been sought, but may not always  have been located. Such creation errors do not reflect on  the standard of medical care.

## 2018-10-26 ENCOUNTER — Encounter: Payer: Self-pay | Admitting: Physician Assistant

## 2018-10-26 ENCOUNTER — Ambulatory Visit (INDEPENDENT_AMBULATORY_CARE_PROVIDER_SITE_OTHER): Payer: BLUE CROSS/BLUE SHIELD | Admitting: Physician Assistant

## 2018-10-26 VITALS — BP 108/70 | HR 57 | Resp 14 | Ht 73.0 in | Wt 180.0 lb

## 2018-10-26 DIAGNOSIS — D869 Sarcoidosis, unspecified: Secondary | ICD-10-CM

## 2018-10-26 DIAGNOSIS — E559 Vitamin D deficiency, unspecified: Secondary | ICD-10-CM | POA: Diagnosis not present

## 2018-10-26 DIAGNOSIS — Z9849 Cataract extraction status, unspecified eye: Secondary | ICD-10-CM

## 2018-10-26 DIAGNOSIS — H209 Unspecified iridocyclitis: Secondary | ICD-10-CM | POA: Diagnosis not present

## 2018-10-26 DIAGNOSIS — Z79899 Other long term (current) drug therapy: Secondary | ICD-10-CM

## 2018-10-26 MED ORDER — METHOTREXATE (PF) 10 MG/0.4ML ~~LOC~~ SOAJ: 10.0000 mg | SUBCUTANEOUS | 0 refills | Status: DC

## 2018-10-26 NOTE — Patient Instructions (Signed)
Standing Labs We placed an order today for your standing lab work.    Please come back and get your standing labs in May and every 3 months  We have open lab Monday through Friday from 8:30-11:30 AM and 1:30-4:00 PM  at the office of Dr. Shaili Deveshwar.   You may experience shorter wait times on Monday and Friday afternoons. The office is located at [address], [address], [place], [place] [address] No appointment is necessary.   Labs are drawn by [facility].  You may receive a bill from [facility] for your lab work.  If you wish to have your labs drawn at another location, please call the office 24 hours in advance to send orders.  If you have any questions regarding directions or hours of operation,  please call [phone].   Just as a reminder please drink plenty of water prior to coming for your lab work. Thanks!  

## 2018-10-27 LAB — CBC WITH DIFFERENTIAL/PLATELET
Absolute Monocytes: 954 cells/uL — ABNORMAL HIGH (ref 200–950)
Basophils Absolute: 51 cells/uL (ref 0–200)
Basophils Relative: 0.8 %
Eosinophils Absolute: 269 cells/uL (ref 15–500)
Eosinophils Relative: 4.2 %
HCT: 40.9 % (ref 38.5–50.0)
Hemoglobin: 14.1 g/dL (ref 13.2–17.1)
Lymphs Abs: 1146 cells/uL (ref 850–3900)
MCH: 31.1 pg (ref 27.0–33.0)
MCHC: 34.5 g/dL (ref 32.0–36.0)
MCV: 90.3 fL (ref 80.0–100.0)
MPV: 9 fL (ref 7.5–12.5)
Monocytes Relative: 14.9 %
Neutro Abs: 3981 cells/uL (ref 1500–7800)
Neutrophils Relative %: 62.2 %
Platelets: 218 10*3/uL (ref 140–400)
RBC: 4.53 10*6/uL (ref 4.20–5.80)
RDW: 13 % (ref 11.0–15.0)
Total Lymphocyte: 17.9 %
WBC: 6.4 10*3/uL (ref 3.8–10.8)

## 2018-10-27 LAB — COMPLETE METABOLIC PANEL WITH GFR
AG Ratio: 1.4 (calc) (ref 1.0–2.5)
ALT: 27 U/L (ref 9–46)
AST: 30 U/L (ref 10–35)
Albumin: 4.3 g/dL (ref 3.6–5.1)
Alkaline phosphatase (APISO): 65 U/L (ref 35–144)
BUN: 13 mg/dL (ref 7–25)
CO2: 28 mmol/L (ref 20–32)
Calcium: 9.4 mg/dL (ref 8.6–10.3)
Chloride: 103 mmol/L (ref 98–110)
Creat: 1.18 mg/dL (ref 0.70–1.33)
GFR, Est African American: 82 mL/min/{1.73_m2} (ref >=60)
GFR, Est Non African American: 71 mL/min/{1.73_m2} (ref >=60)
Globulin: 3 g/dL (calc) (ref 1.9–3.7)
Glucose, Bld: 87 mg/dL (ref 65–99)
Potassium: 4.2 mmol/L (ref 3.5–5.3)
Sodium: 140 mmol/L (ref 135–146)
Total Bilirubin: 0.6 mg/dL (ref 0.2–1.2)
Total Protein: 7.3 g/dL (ref 6.1–8.1)

## 2018-10-27 NOTE — Progress Notes (Signed)
CBC and CMP WNL

## 2018-11-24 ENCOUNTER — Telehealth: Payer: Self-pay | Admitting: Rheumatology

## 2018-11-24 NOTE — Telephone Encounter (Signed)
Patient calling to discuss if he should stop his medication due to Coronavirus since doctor had advised him in the past to atop it if it was making him sick. Please call to advise.

## 2018-11-25 NOTE — Telephone Encounter (Signed)
Patient advised regarding your medications we recommend you continue them at this time as prescribed. In the event you do become sick please follow recommended procedure to hold medication until symptoms have resolved.  We understand concerns about increased risk of infection. If you do choose to stop your medications understand you are putting yourself at risk for a flare. Please call the office for any questions or acute issues. We are allowing some flexibly for labs and follow up visit. If you are having current respiratory symptoms do not come to the office, please utilize e-visits and your PCP for appropriate assessment and testing.

## 2019-01-26 ENCOUNTER — Other Ambulatory Visit: Payer: Self-pay | Admitting: Physician Assistant

## 2019-01-26 NOTE — Telephone Encounter (Signed)
Last Visit: 10/26/18 Next visit: 03/22/19 Labs: 10/26/18 WNL   Patient advised he is due for lab. Patient will update.   Okay to refill 30 day supply per Dr. Corliss Skains

## 2019-03-08 NOTE — Progress Notes (Signed)
Office Visit Note  Patient: Alexander Nelson             Date of Birth: 03/31/1966           MRN: [id number]             PCP: Golden Hurter, MD Referring: Golden Hurter, MD Visit Date: 03/22/2019 Occupation: @GUAROCC @  Subjective:  Medication monitoring    History of Present Illness: Alexander Nelson is a 53 y.o. male with history of sarcoidosis and uveitis.  He denies any recent signs or symptoms of a sarcoidosis flare.  He denies any coughing or shortness of breath.  He continues to exercise on a regular basis.  He does crossfit.  He denies any joint pain or joint swelling.  He denies any joint stiffness.  He denies any uveitis flares.  He saw his ophthalmologist 1 month ago and reports everything was well controlled.  He would like to further reduce the dose of Otrexup.  Activities of Daily Living:  Patient reports morning stiffness for 0 minutes.   Patient Denies nocturnal pain.  Difficulty dressing/grooming: Denies Difficulty climbing stairs: Denies Difficulty getting out of chair: Denies Difficulty using hands for taps, buttons, cutlery, and/or writing: Denies  Review of Systems  Constitutional: Negative for fatigue and night sweats.  HENT: Negative for mouth sores, mouth dryness and nose dryness.   Eyes: Negative for redness and dryness.  Respiratory: Negative for cough, hemoptysis, shortness of breath and difficulty breathing.   Cardiovascular: Negative for chest pain, palpitations, hypertension, irregular heartbeat and swelling in legs/feet.  Gastrointestinal: Negative for blood in stool, constipation and diarrhea.  Endocrine: Negative for increased urination.  Genitourinary: Negative for painful urination.  Musculoskeletal: Negative for arthralgias, joint pain, joint swelling, myalgias, muscle weakness, morning stiffness, muscle tenderness and myalgias.  Skin: Negative for color change, rash, hair loss, nodules/bumps, skin tightness, ulcers and sensitivity to  sunlight.  Allergic/Immunologic: Negative for susceptible to infections.  Neurological: Negative for dizziness, fainting, memory loss, night sweats and weakness.  Hematological: Negative for swollen glands.  Psychiatric/Behavioral: Negative for depressed mood and sleep disturbance. The patient is not nervous/anxious.     PMFS History:  Patient Active Problem List   Diagnosis Date Noted  . Vitamin D deficiency 12/17/2016  . High risk medication use 12/16/2016  . Uveitis 12/16/2016  . Sarcoidosis 10/18/2014    Past Medical History:  Diagnosis Date  . Abnormal chest x-ray 3 weeks ago  . Sarcoidosis     Family History  Problem Relation Age of Onset  . Diabetes Mother   . Heart attack Father   . Kidney disease Brother   . Healthy Daughter   . Healthy Daughter   . Healthy Daughter    Past Surgical History:  Procedure Laterality Date  . CATARACT EXTRACTION Left   . ENDOBRONCHIAL ULTRASOUND Bilateral 11/14/2014   Procedure: ENDOBRONCHIAL ULTRASOUND;  Surgeon: Kathee Delton, MD;  Location: [facility];  Service: Endoscopy;  Laterality: Bilateral;  . INGUINAL HERNIA REPAIR Bilateral 04/04/2016   Procedure: LAPAROSCOPIC BILATERAL INGUINAL HERNIA REPAIR;  Surgeon: Jackolyn Confer, MD;  Location: [facility];  Service: General;  Laterality: Bilateral;  . INSERTION OF MESH Bilateral 04/04/2016   Procedure: INSERTION OF MESH;  Surgeon: Jackolyn Confer, MD;  Location: [facility];  Service: General;  Laterality: Bilateral;  . REFRACTIVE SURGERY  2002  . TONSILLECTOMY  age 32   Social History   Social History Narrative  . Not on file   Immunization History  Administered Date(s)  Administered  . Influenza Split 06/24/2015  . Influenza,inj,Quad PF,6+ Mos 06/03/2016  . Influenza-Unspecified 07/10/2014     Objective: Vital Signs: BP (!) 99/59 (BP Location: Right Arm, Patient Position: Sitting, Cuff Size: Normal)   Pulse (!) 52   Resp 12   Ht 6\' 1"  (1.854 m)   Wt 180 lb (81.6 kg)   BMI 23.75  kg/m    Physical Exam Vitals signs and nursing note reviewed.  Constitutional:      Appearance: He is well-developed.  HENT:     Head: Normocephalic and atraumatic.  Eyes:     Conjunctiva/sclera: Conjunctivae normal.     Pupils: Pupils are equal, round, and reactive to light.  Neck:     Musculoskeletal: Normal range of motion and neck supple.  Cardiovascular:     Rate and Rhythm: Normal rate and regular rhythm.     Heart sounds: Normal heart sounds.  Pulmonary:     Effort: Pulmonary effort is normal.     Breath sounds: Normal breath sounds.  Abdominal:     General: Bowel sounds are normal.     Palpations: Abdomen is soft.  Skin:    General: Skin is warm and dry.     Capillary Refill: Capillary refill takes less than 2 seconds.  Neurological:     Mental Status: He is alert and oriented to person, place, and time.  Psychiatric:        Behavior: Behavior normal.      Musculoskeletal Exam: C-spine, thoracic spine, and lumbar spine good ROM.  No midline spinal tenderness.  No SI joint tenderness.  Shoulder joints, elbow joints, wrist joints, MCPs, PIPs, and DIPs good ROM with no synovitis.  Hip joints, knee joints, ankle joints, MTPs, PIPs, and DIPs good ROM with no synovitis.  No warmth or effusion of knee joints.  No tenderness or swelling of ankle joints.    CDAI Exam: CDAI Score: - Patient Global: -; Provider Global: - Swollen: -; Tender: - Joint Exam   No joint exam has been documented for this visit   There is currently no information documented on the homunculus. Go to the Rheumatology activity and complete the homunculus joint exam.  Investigation: No additional findings.  Imaging: No results found.  Recent Labs: Lab Results  Component Value Date   WBC 6.4 10/26/2018   HGB 14.1 10/26/2018   PLT 218 10/26/2018   NA 140 10/26/2018   K 4.2 10/26/2018   CL 103 10/26/2018   CO2 28 10/26/2018   GLUCOSE 87 10/26/2018   BUN 13 10/26/2018   CREATININE 1.18  10/26/2018   BILITOT 0.6 10/26/2018   ALKPHOS 65 12/23/2016   AST 30 10/26/2018   ALT 27 10/26/2018   PROT 7.3 10/26/2018   ALBUMIN 4.3 12/23/2016   CALCIUM 9.4 10/26/2018   GFRAA 82 10/26/2018    Speciality Comments: No specialty comments available.  Procedures:  No procedures performed Allergies: Bee venom     Assessment / Plan:     Visit Diagnoses: Sarcoidosis - He has not had any signs or symptoms of a flare.  He has no coughing or shortness of breath.  He has no joint pain or joint swelling at this time.  He continues to perform CrossFit workouts on a regular basis.  He was advised to notify us if he develops any signs or symptoms of a flare.  He will follow up in 5 months.   Uveitis - He continues to see Dr. Allyne GeeSanders on a regular basis.  His most recent appointment was 1 month ago.  He has not had any recent signs or symptoms of a flare. He has no eye pain, floaters, or photophobia.  He has clinically been doing well on Otrexup 10 mg sq once weekly.  He would like to further decrease his dose of Otrexup.  He will reduce to 7.5 mg sq once weekly.  Otrexup is not available that the desired dose, so we will send in a prescription for Rasuvo 7.5 mg sq weekly injections.  He will also reduce folic acid to 1 mg po daily.  He was advised to notify us if he develops any signs or symptoms of a flare.   High risk medication use - Rasuvo 7.5 mg every 7 days and folic acid 1 mg daily.  Most recent CBC within normal limits on 02/08/2019. Standing orders are in place.   History of cataract extraction   Vitamin D deficiency: He is taking vitamin D 2,000 units by mouth daily.  Orders: No orders of the defined types were placed in this encounter.  No orders of the defined types were placed in this encounter.     Follow-Up Instructions: Return in about 5 months (around 08/22/2019) for Sarcoidosis, uveitis .   Gearldine Bienenstockaylor M , PA-C  Note - This record has been created using Dragon software.   Chart creation errors have been sought, but may not always  have been located. Such creation errors do not reflect on  the standard of medical care.

## 2019-03-22 ENCOUNTER — Other Ambulatory Visit: Payer: Self-pay

## 2019-03-22 ENCOUNTER — Encounter: Payer: Self-pay | Admitting: Physician Assistant

## 2019-03-22 ENCOUNTER — Ambulatory Visit (INDEPENDENT_AMBULATORY_CARE_PROVIDER_SITE_OTHER): Payer: BC Managed Care – PPO | Admitting: Physician Assistant

## 2019-03-22 VITALS — BP 99/59 | HR 52 | Resp 12 | Ht 73.0 in | Wt 180.0 lb

## 2019-03-22 DIAGNOSIS — H209 Unspecified iridocyclitis: Secondary | ICD-10-CM

## 2019-03-22 DIAGNOSIS — E559 Vitamin D deficiency, unspecified: Secondary | ICD-10-CM

## 2019-03-22 DIAGNOSIS — Z79899 Other long term (current) drug therapy: Secondary | ICD-10-CM

## 2019-03-22 DIAGNOSIS — Z9849 Cataract extraction status, unspecified eye: Secondary | ICD-10-CM

## 2019-03-22 DIAGNOSIS — D869 Sarcoidosis, unspecified: Secondary | ICD-10-CM | POA: Diagnosis not present

## 2019-03-22 MED ORDER — RASUVO 7.5 MG/0.15ML ~~LOC~~ SOAJ: 7.5000 mg | SUBCUTANEOUS | 0 refills | Status: DC

## 2019-03-22 NOTE — Progress Notes (Signed)
Pharmacy Note  Patient dose of methotrexate will be changed to 7.5 mg.  He is completely out of medication.  Otrexup does not come in 7.5 mg but Rasuvo does.  No samples are avaliable for Rasuvo 7.5 mg.  Will give sample of Otrexup 10 mg for this weeks dose.   Medication Samples have been provided to the patient.  Drug name: Otrexup Strength: 10 mg       AST:[id number] Qty: 1   GXQ:[id number]   Exp.Date: 12/2019  Dosing instructions: Inject 1 pen every 7 days  The patient has been instructed regarding the correct time, dose, and frequency of taking this medication, including desired effects and most common side effects.   Christohper Dube C Dempsey Knotek 1:46 PM 03/22/2019

## 2019-04-06 ENCOUNTER — Other Ambulatory Visit: Payer: Self-pay | Admitting: Physician Assistant

## 2019-04-06 DIAGNOSIS — H209 Unspecified iridocyclitis: Secondary | ICD-10-CM

## 2019-04-22 ENCOUNTER — Telehealth: Payer: Self-pay | Admitting: Rheumatology

## 2019-04-22 ENCOUNTER — Other Ambulatory Visit: Payer: Self-pay | Admitting: Physician Assistant

## 2019-04-22 DIAGNOSIS — H209 Unspecified iridocyclitis: Secondary | ICD-10-CM

## 2019-04-22 NOTE — Telephone Encounter (Signed)
Too early for refill. 90 day supply sent on 03/22/19.

## 2019-04-22 NOTE — Telephone Encounter (Signed)
[facility] called requesting prescription refill of Rasuvo for patient.

## 2019-04-22 NOTE — Telephone Encounter (Signed)
Too soon to refill. 90 day supply sent 03/22/19

## 2019-04-29 ENCOUNTER — Other Ambulatory Visit: Payer: Self-pay | Admitting: Physician Assistant

## 2019-04-29 ENCOUNTER — Telehealth: Payer: Self-pay | Admitting: Rheumatology

## 2019-04-29 DIAGNOSIS — H209 Unspecified iridocyclitis: Secondary | ICD-10-CM

## 2019-04-29 NOTE — Telephone Encounter (Signed)
[facility] requesting refill for patients Resuvo auto injector 7.5 mg. Ref# [id number]. Fax# [phone]

## 2019-04-29 NOTE — Telephone Encounter (Signed)
Spoke with patient who states he has not been able to receive his medication. Patient advocate Ralph Leyden has looked into this and found that the patient's insurance only allows for the patient ot receive 30 day supply at a time. [facility] and contact patient as we sent 90 day supply in July. Left message to advise patient and to also advise him he is not locked into this pharmacy and can use a local pharmacy to fill the rasuvo.

## 2019-06-15 ENCOUNTER — Other Ambulatory Visit: Payer: Self-pay | Admitting: Physician Assistant

## 2019-06-15 NOTE — Telephone Encounter (Signed)
Last Visit: 03/22/19 Next Visit: 08/23/19  Okay to refill per Dr. Estanislado Pandy

## 2019-06-28 ENCOUNTER — Telehealth: Payer: Self-pay | Admitting: Rheumatology

## 2019-06-28 DIAGNOSIS — H209 Unspecified iridocyclitis: Secondary | ICD-10-CM

## 2019-06-28 MED ORDER — RASUVO 7.5 MG/0.15ML ~~LOC~~ SOAJ: 7.5000 mg | SUBCUTANEOUS | 0 refills | Status: DC

## 2019-06-28 NOTE — Telephone Encounter (Signed)
Patient called requesting prescription refill of Rasuvo to be sent to [facility].

## 2019-06-28 NOTE — Telephone Encounter (Signed)
Last Visit: 03/22/19 Next Visit: 08/23/19 Labs: 03/15/19 WNL  Patient advised he is due to update labs. Patient states he will update this week.    Okay to refill per Dr. Estanislado Pandy

## 2019-07-01 ENCOUNTER — Other Ambulatory Visit: Payer: Self-pay | Admitting: Physician Assistant

## 2019-07-01 DIAGNOSIS — H209 Unspecified iridocyclitis: Secondary | ICD-10-CM

## 2019-07-07 ENCOUNTER — Other Ambulatory Visit: Payer: Self-pay | Admitting: Rheumatology

## 2019-07-07 DIAGNOSIS — H209 Unspecified iridocyclitis: Secondary | ICD-10-CM

## 2019-07-07 NOTE — Telephone Encounter (Addendum)
Last Visit: 03/22/19 Next Visit: 08/23/19 Labs: 03/15/19 WNL  Patient going this week to have blood work updated.   Okay to refill 30 day supply per Dr. Estanislado Pandy

## 2019-07-08 ENCOUNTER — Encounter: Payer: Self-pay | Admitting: Physician Assistant

## 2019-07-13 ENCOUNTER — Other Ambulatory Visit: Payer: Self-pay | Admitting: Rheumatology

## 2019-07-13 DIAGNOSIS — H209 Unspecified iridocyclitis: Secondary | ICD-10-CM

## 2019-08-19 NOTE — Progress Notes (Signed)
Virtual Visit via Telephone Note  I connected with Alexander Nelson on 08/23/19 at 12:15 PM EST by telephone and verified that I am speaking with the correct person using two identifiers.  Location: Patient: Home  Provider: Clinic  This service was conducted via virtual visit. The patient was located at home. I was located in my office.  Consent was obtained prior to the virtual visit and is aware of possible charges through their insurance for this visit.  The patient is an established patient.  Dr. Corliss Skains, MD conducted the virtual visit and Sherron Ales, PA-C acted as scribe during the service.  Office staff helped with scheduling follow up visits after the service was conducted.   I discussed the limitations, risks, security and privacy concerns of performing an evaluation and management service by telephone and the availability of in person appointments. I also discussed with the patient that there may be a patient responsible charge related to this service. The patient expressed understanding and agreed to proceed.  CC: Medication monitoring  History of Present Illness: Patient is a 53 year old male with a past medical history of uveitis and sarcoidosis.  He is currently on Rasuvo 7.5 mg sq injections every week and folic acid 1 mg po daily.   He has not had any recent uveitis or sarcoidosis flares.  He has no eye pain or inflammation at this time.  He has not had any new or pulmonary symptoms.  He has no joint pain or joint swelling.  He has no morning stiffness.    Review of Systems  Constitutional: Negative for fever and malaise/fatigue.  Eyes: Negative for photophobia, pain, discharge and redness.  Respiratory: Negative for cough, shortness of breath and wheezing.   Cardiovascular: Negative for chest pain and palpitations.  Gastrointestinal: Negative for blood in stool, constipation and diarrhea.  Genitourinary: Negative for dysuria.  Musculoskeletal: Negative for back pain, joint  pain, myalgias and neck pain.  Skin: Negative for rash.  Neurological: Negative for dizziness and headaches.  Psychiatric/Behavioral: Negative for depression. The patient is not nervous/anxious and does not have insomnia.       Observations/Objective: Physical Exam  Constitutional: He is oriented to person, place, and time.  Neurological: He is alert and oriented to person, place, and time.  Psychiatric: Mood, memory, affect and judgment normal.   Patient reports morning stiffness for 0 minute.   Patient denies nocturnal pain.  Difficulty dressing/grooming: Denies Difficulty climbing stairs: Denies Difficulty getting out of chair: Denies Difficulty using hands for taps, buttons, cutlery, and/or writing: Denies   Assessment and Plan: Visit Diagnoses: Sarcoidosis -He is not experiencing any new or worsening pulmonary symptoms at this time.  He has not had any recurrences.  No EN lesions.  He has not had any joint pain or inflammation. He is clinically doing well on Rasuvo 7.5 mg sq injections once weekly and folic acid 1 mg po daily.  He will continue on the current regimen.  He was advised to notify us if he develops symptoms of a flare.  He will follow up in 4-5 months.  Uveitis - He continues to see Dr. Allyne Gee on a regular basis.  He has not had any recent uveitis flares.  He has no eye pain or inflammation at this time.  He is clinically doing well on Rasuvo 7.5 mg sq injections every week and folic acid 1 mg po daily.  He questioned if he could further reduce the dose of Rasuvo or discontinue, but we  explained the risks of having a flare.  He will continue on Rasuvo and folic acid as prescribed.  He was advised to notify us if he develops signs or symptoms of a flare.  High risk medication use - Rasuvo 7.5 mg every 7 days and folic acid 1 mg daily. CBC and CMP were drawn on 07/08/19.  He will be due to update CBC and CMP in January and every 3 months.  Standing orders are in placed.    History of cataract extraction   Vitamin D deficiency: He is taking vitamin D 5,000 units by mouth daily.  Follow Up Instructions: He will follow up in 4-5 months.    I discussed the assessment and treatment plan with the patient. The patient was provided an opportunity to ask questions and all were answered. The patient agreed with the plan and demonstrated an understanding of the instructions.   The patient was advised to call back or seek an in-person evaluation if the symptoms worsen or if the condition fails to improve as anticipated.  I provided 15 minutes of non-face-to-face time during this encounter.   Bo Merino, MD    Scribed by-   Hazel Sams, PA-C

## 2019-08-23 ENCOUNTER — Encounter: Payer: Self-pay | Admitting: Rheumatology

## 2019-08-23 ENCOUNTER — Other Ambulatory Visit: Payer: Self-pay

## 2019-08-23 ENCOUNTER — Telehealth (INDEPENDENT_AMBULATORY_CARE_PROVIDER_SITE_OTHER): Payer: BC Managed Care – PPO | Admitting: Rheumatology

## 2019-08-23 ENCOUNTER — Ambulatory Visit: Payer: BC Managed Care – PPO | Admitting: Physician Assistant

## 2019-08-23 DIAGNOSIS — Z9849 Cataract extraction status, unspecified eye: Secondary | ICD-10-CM | POA: Diagnosis not present

## 2019-08-23 DIAGNOSIS — E559 Vitamin D deficiency, unspecified: Secondary | ICD-10-CM

## 2019-08-23 DIAGNOSIS — D869 Sarcoidosis, unspecified: Secondary | ICD-10-CM

## 2019-08-23 DIAGNOSIS — Z79899 Other long term (current) drug therapy: Secondary | ICD-10-CM

## 2019-08-23 DIAGNOSIS — H209 Unspecified iridocyclitis: Secondary | ICD-10-CM

## 2019-08-31 ENCOUNTER — Telehealth: Payer: Self-pay | Admitting: Rheumatology

## 2019-08-31 NOTE — Telephone Encounter (Signed)
LMOM for patient to call and schedule 4-5 month follow-up appointment. 

## 2019-08-31 NOTE — Telephone Encounter (Signed)
-----   Message from Sharon H Caudle, RT sent at 08/23/2019  2:13 PM EST ----- Regarding: 4-5 MONTH F/U   

## 2019-09-06 ENCOUNTER — Other Ambulatory Visit: Payer: Self-pay | Admitting: Rheumatology

## 2019-09-06 DIAGNOSIS — H209 Unspecified iridocyclitis: Secondary | ICD-10-CM

## 2019-09-06 NOTE — Telephone Encounter (Signed)
Last Visit: 08/23/2019 telemedicine  Next Visit: message sent to the front desk to schedule.  Labs: 07/08/2019  Okay to refill per Dr. Estanislado Pandy.

## 2019-10-12 ENCOUNTER — Telehealth: Payer: Self-pay | Admitting: Rheumatology

## 2019-10-12 NOTE — Telephone Encounter (Signed)
Patient states he thought he had Covid and has been holding his MTX. Patient states he has had 2 negative rapid test. Patient states he is still having symptoms. Patient is following up with PCP and will have an antibody test. Patient advised to continue to hold the medication until symptoms have resolved. Patient verbalized understanding.

## 2019-10-12 NOTE — Telephone Encounter (Signed)
Patient called stating he stopped taking his Methotrexate approximately 2 weeks ago due to having body aches, headaches, fatigue.  Patient states he has had 2 Rapid COVID tests which have both been negative.  Patient states he is still experiencing headaches, as well as being fatigued and is not sure when he should restart the medication.  Patient requested a return call.

## 2020-02-25 ENCOUNTER — Other Ambulatory Visit: Payer: Self-pay | Admitting: Rheumatology

## 2020-02-25 DIAGNOSIS — H209 Unspecified iridocyclitis: Secondary | ICD-10-CM

## 2020-03-15 ENCOUNTER — Other Ambulatory Visit: Payer: Self-pay | Admitting: Rheumatology

## 2020-03-15 DIAGNOSIS — H209 Unspecified iridocyclitis: Secondary | ICD-10-CM

## 2020-03-16 ENCOUNTER — Telehealth: Payer: Self-pay | Admitting: Rheumatology

## 2020-03-16 NOTE — Telephone Encounter (Signed)
Patient called stating he received an email from [facility] stating they cannot refill his prescription of Methotrexate due to not receiving a response from our office.  Patient requested a return call.

## 2020-03-16 NOTE — Telephone Encounter (Signed)
Left message to advise patient we responded to refill request and are unable to fill it at this time. Patient advised he is due for a follow up appointment and overdue for labs. Requested for patient to call the office.

## 2020-04-11 ENCOUNTER — Telehealth: Payer: Self-pay | Admitting: *Deleted

## 2020-04-11 NOTE — Telephone Encounter (Signed)
Labs Received from [facility]  Drawn on 03/28/2020 Reviewed by Dr. Corliss Skains   CBC/UA/hepatic Function Panel/BMP/Lipid Panel/PSA/TSH/Vitamin D  MCH 31.6 RDW 14.2 MID% 10.4 Glucose 102 HDL Chol. 43.4 LDL Chol. 111  Patient on Rasuvo 7.5 mg SQ weekly.

## 2020-04-24 NOTE — Progress Notes (Deleted)
Office Visit Note  Patient: Alexander Nelson             Date of Birth: 19-Jul-1966           MRN: [id number]             PCP: Brynda Peon, MD Referring: Brynda Peon, MD Visit Date: 05/08/2020 Occupation: @GUAROCC @  Subjective:  No chief complaint on file.   History of Present Illness: Alexander Nelson is a 54 y.o. male ***   Activities of Daily Living:  Patient reports morning stiffness for *** {minute/hour:19697}.   Patient {ACTIONS;DENIES/REPORTS:21021675::"Denies"} nocturnal pain.  Difficulty dressing/grooming: {ACTIONS;DENIES/REPORTS:21021675::"Denies"} Difficulty climbing stairs: {ACTIONS;DENIES/REPORTS:21021675::"Denies"} Difficulty getting out of chair: {ACTIONS;DENIES/REPORTS:21021675::"Denies"} Difficulty using hands for taps, buttons, cutlery, and/or writing: {ACTIONS;DENIES/REPORTS:21021675::"Denies"}  No Rheumatology ROS completed.   PMFS History:  Patient Active Problem List   Diagnosis Date Noted  . Vitamin D deficiency 12/17/2016  . High risk medication use 12/16/2016  . Uveitis 12/16/2016  . Sarcoidosis 10/18/2014    Past Medical History:  Diagnosis Date  . Abnormal chest x-ray 3 weeks ago  . Sarcoidosis     Family History  Problem Relation Age of Onset  . Diabetes Mother   . Heart attack Father   . Kidney disease Brother   . Healthy Daughter   . Healthy Daughter   . Healthy Daughter    Past Surgical History:  Procedure Laterality Date  . CATARACT EXTRACTION Left   . ENDOBRONCHIAL ULTRASOUND Bilateral 11/14/2014   Procedure: ENDOBRONCHIAL ULTRASOUND;  Surgeon: 01/14/2015, MD;  Location: [facility];  Service: Endoscopy;  Laterality: Bilateral;  . INGUINAL HERNIA REPAIR Bilateral 04/04/2016   Procedure: LAPAROSCOPIC BILATERAL INGUINAL HERNIA REPAIR;  Surgeon: 04/06/2016, MD;  Location: [facility];  Service: General;  Laterality: Bilateral;  . INSERTION OF MESH Bilateral 04/04/2016   Procedure: INSERTION OF MESH;  Surgeon: 04/06/2016, MD;  Location: [facility];  Service: General;  Laterality: Bilateral;  . REFRACTIVE SURGERY  2002  . TONSILLECTOMY  age 48   Social History   Social History Narrative  . Not on file   Immunization History  Administered Date(s) Administered  . Influenza Split 06/24/2015  . Influenza,inj,Quad PF,6+ Mos 06/03/2016  . Influenza-Unspecified 07/10/2014     Objective: Vital Signs: There were no vitals taken for this visit.   Physical Exam   Musculoskeletal Exam: ***  CDAI Exam: CDAI Score: -- Patient Global: --; Provider Global: -- Swollen: --; Tender: -- Joint Exam 05/08/2020   No joint exam has been documented for this visit   There is currently no information documented on the homunculus. Go to the Rheumatology activity and complete the homunculus joint exam.  Investigation: No additional findings.  Imaging: No results found.  Recent Labs: Lab Results  Component Value Date   WBC 6.4 10/26/2018   HGB 14.1 10/26/2018   PLT 218 10/26/2018   NA 140 10/26/2018   K 4.2 10/26/2018   CL 103 10/26/2018   CO2 28 10/26/2018   GLUCOSE 87 10/26/2018   BUN 13 10/26/2018   CREATININE 1.18 10/26/2018   BILITOT 0.6 10/26/2018   ALKPHOS 65 12/23/2016   AST 30 10/26/2018   ALT 27 10/26/2018   PROT 7.3 10/26/2018   ALBUMIN 4.3 12/23/2016   CALCIUM 9.4 10/26/2018   GFRAA 82 10/26/2018    Speciality Comments: No specialty comments available.  Procedures:  No procedures performed Allergies: Bee venom   Assessment / Plan:     Visit Diagnoses: No diagnosis found.  Orders: No orders of the defined types were placed in this encounter.  No orders of the defined types were placed in this encounter.   Face-to-face time spent with patient was *** minutes. Greater than 50% of time was spent in counseling and coordination of care.  Follow-Up Instructions: No follow-ups on file.   Ellen Henri, CMA  Note - This record has been created using Animal nutritionist.   Chart creation errors have been sought, but may not always  have been located. Such creation errors do not reflect on  the standard of medical care.

## 2020-04-25 ENCOUNTER — Other Ambulatory Visit: Payer: Self-pay | Admitting: Rheumatology

## 2020-04-25 DIAGNOSIS — H209 Unspecified iridocyclitis: Secondary | ICD-10-CM

## 2020-04-25 MED ORDER — RASUVO 7.5 MG/0.15ML ~~LOC~~ SOAJ: SUBCUTANEOUS | 0 refills | Status: DC

## 2020-04-25 NOTE — Telephone Encounter (Signed)
Last Visit: 08/23/19 Next Visit: 05/08/2020 Labs: 03/28/2020 MCH 31.6 RDW 14.2 MID% 10.4 Glucose 102  Current Dose per office note 08/23/2019: Rasuvo 7.5 mg sq injections once weekly  DX: Sarcoidosis  Okay to refill Rasuvo?

## 2020-04-25 NOTE — Telephone Encounter (Signed)
Patient called checking the status of his prescription refill of Rasuvo.  Patient states he has been out of medication for 2 weeks due to our office not receiving labwork results from [facility] which were done on 03/28/20.  Patient states their office faxed results to our office and was told once we received them the medication would be sent to the specialty pharmacy.  Patient states "he needs this taken care of as soon as possible."

## 2020-04-26 NOTE — Telephone Encounter (Signed)
Left message to advise patient prescription has been sent to the pharmacy.  

## 2020-05-08 ENCOUNTER — Ambulatory Visit: Payer: BC Managed Care – PPO | Admitting: Physician Assistant

## 2020-05-08 DIAGNOSIS — Z79899 Other long term (current) drug therapy: Secondary | ICD-10-CM

## 2020-05-08 DIAGNOSIS — H209 Unspecified iridocyclitis: Secondary | ICD-10-CM

## 2020-05-08 DIAGNOSIS — E559 Vitamin D deficiency, unspecified: Secondary | ICD-10-CM

## 2020-05-08 DIAGNOSIS — D869 Sarcoidosis, unspecified: Secondary | ICD-10-CM

## 2020-05-08 DIAGNOSIS — Z9849 Cataract extraction status, unspecified eye: Secondary | ICD-10-CM

## 2020-05-16 NOTE — Progress Notes (Addendum)
Office Visit Note  Patient: Alexander Nelson             Date of Birth: 1966/08/17           MRN: [id number]             PCP: Brynda Peon, MD Referring: Brynda Peon, MD Visit Date: 05/29/2020 Occupation: @GUAROCC @  Subjective:  Medication monitoring   History of Present Illness: Alexander Nelson is a 54 y.o. male with history of sarcoidosis and uveitis.  He is on MTX 7.5 mg sq injections once weekly and folic acid 1 mg daily.  He has not missed any doses of methotrexate or folic acid recently.  He continues to tolerate both medications without any side effects.  He denies any signs or symptoms of a sarcoidosis flare.  He has not had any coughing or shortness of breath recently.  He denies any uveitis flares recently.  He has not had any eye pain, photophobia, or eye redness recently.  He continues to see his ophthalmologist every 6 months.  He denies any recent rashes.  He states that he recently injured his lower back and was evaluated by his PCP.  He had x-rays and an MRI of the lumbar spine and was found to have herniated disc.  According to the patient he took diclofenac as well as Flexeril as needed for pain relief but has discontinued.  His pain has improved substantially.  He will be starting physical therapy next week.  If he has persistent pain he plans on having injections performed in the future.   Activities of Daily Living:  Patient reports morning stiffness for  several hours.   Patient Reports nocturnal pain.  Difficulty dressing/grooming: Denies Difficulty climbing stairs: Denies Difficulty getting out of chair: Denies Difficulty using hands for taps, buttons, cutlery, and/or writing: Denies  Review of Systems  Constitutional: Negative for fatigue.  HENT: Negative for mouth sores, mouth dryness and nose dryness.   Eyes: Negative for pain, redness, itching, visual disturbance and dryness.  Respiratory: Negative for shortness of breath and difficulty breathing.     Cardiovascular: Negative for chest pain and palpitations.  Gastrointestinal: Negative for blood in stool, constipation and diarrhea.  Endocrine: Negative for increased urination.  Genitourinary: Negative for difficulty urinating.  Musculoskeletal: Positive for arthralgias, joint pain, myalgias, morning stiffness and myalgias. Negative for joint swelling and muscle tenderness.  Skin: Negative for color change, rash and redness.  Allergic/Immunologic: Negative for susceptible to infections.  Neurological: Negative for dizziness, numbness, headaches, memory loss and weakness.  Hematological: Negative for bruising/bleeding tendency.  Psychiatric/Behavioral: Negative for confusion.    PMFS History:  Patient Active Problem List   Diagnosis Date Noted  . Vitamin D deficiency 12/17/2016  . High risk medication use 12/16/2016  . Uveitis 12/16/2016  . Sarcoidosis 10/18/2014    Past Medical History:  Diagnosis Date  . Abnormal chest x-ray 3 weeks ago  . Bulging lumbar disc    L4, L5  . Sarcoidosis     Family History  Problem Relation Age of Onset  . Diabetes Mother   . Heart attack Father   . Kidney disease Brother   . Healthy Daughter   . Healthy Daughter   . Healthy Daughter    Past Surgical History:  Procedure Laterality Date  . CATARACT EXTRACTION Left   . ENDOBRONCHIAL ULTRASOUND Bilateral 11/14/2014   Procedure: ENDOBRONCHIAL ULTRASOUND;  Surgeon: 01/14/2015, MD;  Location: [facility];  Service: Endoscopy;  Laterality: Bilateral;  .  INGUINAL HERNIA REPAIR Bilateral 04/04/2016   Procedure: LAPAROSCOPIC BILATERAL INGUINAL HERNIA REPAIR;  Surgeon: Avel Peace, MD;  Location: [facility];  Service: General;  Laterality: Bilateral;  . INSERTION OF MESH Bilateral 04/04/2016   Procedure: INSERTION OF MESH;  Surgeon: Avel Peace, MD;  Location: [facility];  Service: General;  Laterality: Bilateral;  . REFRACTIVE SURGERY  2002  . TONSILLECTOMY  age 28   Social History   Social  History Narrative  . Not on file   Immunization History  Administered Date(s) Administered  . Influenza Split 06/24/2015  . Influenza,inj,Quad PF,6+ Mos 06/03/2016  . Influenza-Unspecified 07/10/2014  . PFIZER SARS-COV-2 Vaccination 12/04/2019, 12/12/2019     Objective: Vital Signs: BP 92/61 (BP Location: Left Arm, Patient Position: Sitting, Cuff Size: Normal)   Pulse 64   Resp 16   Ht 6\' 1"  (1.854 m)   Wt 184 lb 3.2 oz (83.6 kg)   BMI 24.30 kg/m    Physical Exam Vitals and nursing note reviewed.  Constitutional:      Appearance: He is well-developed.  HENT:     Head: Normocephalic and atraumatic.  Eyes:     Conjunctiva/sclera: Conjunctivae normal.     Pupils: Pupils are equal, round, and reactive to light.  Pulmonary:     Effort: Pulmonary effort is normal.  Abdominal:     Palpations: Abdomen is soft.  Musculoskeletal:     Cervical back: Normal range of motion and neck supple.  Skin:    General: Skin is warm and dry.     Capillary Refill: Capillary refill takes less than 2 seconds.  Neurological:     Mental Status: He is alert and oriented to person, place, and time.  Psychiatric:        Behavior: Behavior normal.      Musculoskeletal Exam: C-spine, thoracic spine, and lumbar spine good ROM. Discomfort with lumbar spine ROM.  Shoulder joints, elbow joints, wrist joints, MCPs,PIPs, and DIPs good ROM with no synovitis.  Complete fist formation bilaterally. Hip joints, knee joints, ankle joints, MTPs, PIPs, and DIPs good ROM with no synovitis.  No warmth or effusion of knee joints.  No tenderness or swelling of ankle joints.  No tenderness of MTP joints.    CDAI Exam: CDAI Score: -- Patient Global: --; Provider Global: -- Swollen: --; Tender: -- Joint Exam 05/29/2020   No joint exam has been documented for this visit   There is currently no information documented on the homunculus. Go to the Rheumatology activity and complete the homunculus joint  exam.  Investigation: No additional findings.  Imaging: No results found.  Recent Labs: Lab Results  Component Value Date   WBC 6.4 10/26/2018   HGB 14.1 10/26/2018   PLT 218 10/26/2018   NA 140 10/26/2018   K 4.2 10/26/2018   CL 103 10/26/2018   CO2 28 10/26/2018   GLUCOSE 87 10/26/2018   BUN 13 10/26/2018   CREATININE 1.18 10/26/2018   BILITOT 0.6 10/26/2018   ALKPHOS 65 12/23/2016   AST 30 10/26/2018   ALT 27 10/26/2018   PROT 7.3 10/26/2018   ALBUMIN 4.3 12/23/2016   CALCIUM 9.4 10/26/2018   GFRAA 82 10/26/2018    Speciality Comments: No specialty comments available.  Procedures:  No procedures performed Allergies: Bee venom   Assessment / Plan:     Visit Diagnoses: Sarcoidosis:  He has not had any signs or symptoms of a flare recently.  He is clinically doing well on Rasuvo 7.5 mg sq injections  once weekly and folic acid 1 mg by mouth daily.  He is tolerating Rasuvo without any side effects and has not missed any doses recently.  He has not had any new or worsening pulmonary symptoms.  He has not had any recent uveitis flares.  He is not experiencing any eye pain, photophobia, and no conjunctival injection was noted on exam.  He is followed by his ophthalmologist closely every 6 months.  He has not had any recent rashes.  No signs of inflammatory arthritis at this time.  No synovitis was noted on exam.  He will continue on the current treatment regimen.  He does not need any refills at this time.  He was advised to notify us if he develops signs or symptoms of a flare.  He will follow-up in the office in 5 months.  Uveitis - He has not had any signs or symptoms of uveitis flare recently.  He has not experienced photophobia, eye pain, eye dryness, or eye redness.  He does have floaters but denies any other vision changes.  He will continue on Rasuvo 7.5 mg sq injections once weekly and folic acid 1 mg by mouth daily.  He will continue to see Dr. Allyne Gee every 6  months.  High risk medication use - Rasuvo 7.5 mg every 7 days and folic acid 1 mg daily.  CBC, BMP, and hepatic function panel were checked on 03/28/2020.  He requested to have lab work drawn today while he is in the office.  Orders for CBC and CMP were released.- Plan: CBC with Differential/Platelet, COMPLETE METABOLIC PANEL WITH GFR He has received both COVID-19 vaccinations and was encouraged to receive the third dose.  He was advised to avoid taking Tylenol and NSAIDs 24 hours prior to the third dose.  He was also advised to hold methotrexate 1 week after receiving the third dose.  He will notify us or his PCP if he develops a COVID-19 infection in order to receive the antibody infusion.  He voiced understanding. He was encouraged to receive the Shingrix vaccination as well as the influenza vaccine and plans on further discussing with his PCP.  History of cataract extraction  Vitamin D deficiency: He is taking vitamin D 2,000 units daily.   Acute midline low back pain without sciatica: He recently injured his lower back, and he was evaluated by his PCP.  He had x-rays and a MRI of the lumbar spine and was found to have a herniated disc according to the patient.  He was taking flexeril and diclofenac as needed for symptomatic relief.  His discomfort has improved significantly.  He will be starting PT next week.  If his symptoms persist or worsen he plans on being referred for spinal injections.   Orders: Orders Placed This Encounter  Procedures  . CBC with Differential/Platelet  . COMPLETE METABOLIC PANEL WITH GFR   No orders of the defined types were placed in this encounter.    Follow-Up Instructions: Return in about 5 months (around 10/29/2020) for Sarcoidosis, Uveitis .   Gearldine Bienenstock, PA-C  Note - This record has been created using Dragon software.  Chart creation errors have been sought, but may not always  have been located. Such creation errors do not reflect on  the standard  of medical care.

## 2020-05-29 ENCOUNTER — Encounter: Payer: Self-pay | Admitting: Physician Assistant

## 2020-05-29 ENCOUNTER — Ambulatory Visit (INDEPENDENT_AMBULATORY_CARE_PROVIDER_SITE_OTHER): Payer: BC Managed Care – PPO | Admitting: Physician Assistant

## 2020-05-29 ENCOUNTER — Other Ambulatory Visit: Payer: Self-pay

## 2020-05-29 VITALS — BP 92/61 | HR 64 | Resp 16 | Ht 73.0 in | Wt 184.2 lb

## 2020-05-29 DIAGNOSIS — Z9849 Cataract extraction status, unspecified eye: Secondary | ICD-10-CM | POA: Diagnosis not present

## 2020-05-29 DIAGNOSIS — H209 Unspecified iridocyclitis: Secondary | ICD-10-CM

## 2020-05-29 DIAGNOSIS — M545 Low back pain, unspecified: Secondary | ICD-10-CM

## 2020-05-29 DIAGNOSIS — D869 Sarcoidosis, unspecified: Secondary | ICD-10-CM | POA: Diagnosis not present

## 2020-05-29 DIAGNOSIS — E559 Vitamin D deficiency, unspecified: Secondary | ICD-10-CM

## 2020-05-29 DIAGNOSIS — Z79899 Other long term (current) drug therapy: Secondary | ICD-10-CM | POA: Diagnosis not present

## 2020-05-29 NOTE — Patient Instructions (Addendum)
Standing Labs We placed an order today for your standing lab work.   Please have your standing labs drawn in December and every 3 months   If possible, please have your labs drawn 2 weeks prior to your appointment so that the provider can discuss your results at your appointment.  We have open lab daily Monday through Thursday from 8:30-12:30 PM and 1:30-4:30 PM and Friday from 8:30-12:30 PM and 1:30-4:00 PM at the office of Dr. Shaili Deveshwar, [facility].   Please be advised, patients with office appointments requiring lab work will take precedents over walk-in lab work.  If possible, please come for your lab work on Monday and Friday afternoons, as you may experience shorter wait times. The office is located at [address], [address], [place], [place] [address] No appointment is necessary.   Labs are drawn by [facility]. Please bring your co-pay at the time of your lab draw.  You may receive a bill from [facility] for your lab work.  If you wish to have your labs drawn at another location, please call the office 24 hours in advance to send orders.  If you have any questions regarding directions or hours of operation,  please call [phone].   As a reminder, please drink plenty of water prior to coming for your lab work. Thanks!   COVID-19 vaccine recommendations:   COVID-19 vaccine is recommended for everyone (unless you are allergic to a vaccine component), even if you are on a medication that suppresses your immune system.   If you are on Methotrexate, Cellcept (mycophenolate), Rinvoq, Xeljanz, and Olumiant- hold the medication for 1 week after each vaccine. Hold Methotrexate for 2 weeks after the single dose COVID-19 vaccine.   If you are on Orencia subcutaneous injection - hold medication one week prior to and one week after the first COVID-19 vaccine dose (only).   If you are on Orencia IV infusions- time vaccination administration so that the first COVID-19  vaccination will occur four weeks after the infusion and postpone the subsequent infusion by one week.   If you are on Cyclophosphamide or Rituxan infusions please contact your doctor prior to receiving the COVID-19 vaccine.   Do not take Tylenol or any anti-inflammatory medications (NSAIDs) 24 hours prior to the COVID-19 vaccination.   There is no direct evidence about the efficacy of the COVID-19 vaccine in individuals who are on medications that suppress the immune system.   Even if you are fully vaccinated, and you are on any medications that suppress your immune system, please continue to wear a mask, maintain at least six feet social distance and practice hand hygiene.   If you develop a COVID-19 infection, please contact your PCP or our office to determine if you need antibody infusion.  The booster vaccine is now available for immunocompromised patients. It is advised that if you had Pfizer vaccine you should get Pfizer booster.  If you had a Moderna vaccine then you should get a Moderna booster. Johnson and Johnson does not have a booster vaccine at this time.  Please see the following web sites for updated information.   https://[email or web address]/[email or web address]/[email or web address]-Vaccination-Patient-Resources.[email or web address]  https://[email or web address].org/About-Us/Newsroom/Press-Releases/ID/1159    

## 2020-05-30 LAB — COMPLETE METABOLIC PANEL WITH GFR
AG Ratio: 1.6 (calc) (ref 1.0–2.5)
ALT: 20 U/L (ref 9–46)
AST: 19 U/L (ref 10–35)
Albumin: 4.2 g/dL (ref 3.6–5.1)
Alkaline phosphatase (APISO): 63 U/L (ref 35–144)
BUN: 21 mg/dL (ref 7–25)
CO2: 29 mmol/L (ref 20–32)
Calcium: 9.4 mg/dL (ref 8.6–10.3)
Chloride: 103 mmol/L (ref 98–110)
Creat: 1.17 mg/dL (ref 0.70–1.33)
GFR, Est African American: 82 mL/min/{1.73_m2} (ref >=60)
GFR, Est Non African American: 71 mL/min/{1.73_m2} (ref >=60)
Globulin: 2.7 g/dL (calc) (ref 1.9–3.7)
Glucose, Bld: 115 mg/dL — ABNORMAL HIGH (ref 65–99)
Potassium: 4.6 mmol/L (ref 3.5–5.3)
Sodium: 139 mmol/L (ref 135–146)
Total Bilirubin: 0.4 mg/dL (ref 0.2–1.2)
Total Protein: 6.9 g/dL (ref 6.1–8.1)

## 2020-05-30 LAB — CBC WITH DIFFERENTIAL/PLATELET
Absolute Monocytes: 561 cells/uL (ref 200–950)
Basophils Absolute: 47 cells/uL (ref 0–200)
Basophils Relative: 0.8 %
Eosinophils Absolute: 319 cells/uL (ref 15–500)
Eosinophils Relative: 5.4 %
HCT: 38.8 % (ref 38.5–50.0)
Hemoglobin: 13.6 g/dL (ref 13.2–17.1)
Lymphs Abs: 2177 cells/uL (ref 850–3900)
MCH: 32.4 pg (ref 27.0–33.0)
MCHC: 35.1 g/dL (ref 32.0–36.0)
MCV: 92.4 fL (ref 80.0–100.0)
MPV: 9.2 fL (ref 7.5–12.5)
Monocytes Relative: 9.5 %
Neutro Abs: 2797 cells/uL (ref 1500–7800)
Neutrophils Relative %: 47.4 %
Platelets: 196 10*3/uL (ref 140–400)
RBC: 4.2 10*6/uL (ref 4.20–5.80)
RDW: 12.4 % (ref 11.0–15.0)
Total Lymphocyte: 36.9 %
WBC: 5.9 10*3/uL (ref 3.8–10.8)

## 2020-05-30 NOTE — Progress Notes (Signed)
Glucose is 115. Rest of CMP WNL. CBC WNL.

## 2020-06-27 ENCOUNTER — Other Ambulatory Visit: Payer: Self-pay | Admitting: Physician Assistant

## 2020-06-27 DIAGNOSIS — H209 Unspecified iridocyclitis: Secondary | ICD-10-CM

## 2020-06-30 ENCOUNTER — Other Ambulatory Visit: Payer: Self-pay | Admitting: *Deleted

## 2020-06-30 DIAGNOSIS — H209 Unspecified iridocyclitis: Secondary | ICD-10-CM

## 2020-06-30 MED ORDER — RASUVO 7.5 MG/0.15ML ~~LOC~~ SOAJ: SUBCUTANEOUS | 0 refills | Status: DC

## 2020-06-30 NOTE — Telephone Encounter (Signed)
Last Visit: 05/29/2020 Next Visit: 10/30/2020 Labs: 05/29/2020 Glucose is 115. Rest of CMP WNL. CBC WNL.   Current Dose per office note 05/29/2020: Rasuvo 7.5 mg sq injections once weekly DX:  Sarcoidosis  Okay to refill Sherron Ales, PA-C

## 2020-08-28 ENCOUNTER — Other Ambulatory Visit: Payer: Self-pay | Admitting: *Deleted

## 2020-08-29 MED ORDER — METHOTREXATE 2.5 MG PO TABS: 5.0000 mg | ORAL_TABLET | ORAL | 0 refills | Status: DC

## 2020-08-29 NOTE — Telephone Encounter (Signed)
Patient states he would like to take oral methotrexate 2.5 mg, 2 tablets p.o. weekly. Patient advised we would send prescription to his local pharmacy.

## 2020-08-29 NOTE — Telephone Encounter (Signed)
We can switch to either oral methotrexate 2.5 mg, 2 tablets p.o. weekly or subcutaneous if he prefers.

## 2020-08-29 NOTE — Telephone Encounter (Signed)
Received notes from [employer].  Reviewed by Sherron Ales, PA-C and Dr. Corliss Skains   Dr. Allyne Gee supports reducing dose of MTX. 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   Per Dr. Corliss Skains okay to reduce to 5 mg/wk. Reassess at upcoming appointment.

## 2020-08-29 NOTE — Addendum Note (Signed)
Addended by: Henriette Combs on: 08/29/2020 02:05 PM   Modules accepted: Orders

## 2020-09-23 DIAGNOSIS — Z20822 Contact with and (suspected) exposure to covid-19: Secondary | ICD-10-CM | POA: Diagnosis not present

## 2020-09-23 DIAGNOSIS — Z03818 Encounter for observation for suspected exposure to other biological agents ruled out: Secondary | ICD-10-CM | POA: Diagnosis not present

## 2020-10-16 NOTE — Progress Notes (Signed)
Office Visit Note  Patient: Alexander Nelson             Date of Birth: [birth year]           MRN: [id number]             PCP: Brynda Peon, MD Referring: Brynda Peon, MD Visit Date: 10/30/2020 Occupation: @GUAROCC @  Subjective:  Medication monitoring   History of Present Illness: [name] is a 55 y.o. male with history of sarcoidosis and uveitis.  He has been taking methotrexate 2.51 tablet by mouth once weekly and folic acid 1 mg by mouth daily for the past 2 months.  He misunderstood and thought that the methotrexate tablets were 5 mg so he was only taking 1 tablet instead of 2 tablets on a weekly basis.  He has not noticed any signs or symptoms of a flare on the reduced dose of methotrexate.  He is not having any eye pain, photophobia, eye redness, floaters, or eye dryness.  He continues to follow-up with his ophthalmologist as recommended.  He denies any new or worsening pulmonary symptoms.  He has not noticed any swollen lymph nodes.  He denies any joint pain or joint swelling at this time.  He continues to exercise on a regular basis without difficulty.      Activities of Daily Living:  Patient reports morning stiffness for 0 minutes.   Patient Denies nocturnal pain.  Difficulty dressing/grooming: Denies Difficulty climbing stairs: Denies Difficulty getting out of chair: Denies Difficulty using hands for taps, buttons, cutlery, and/or writing: Denies  Review of Systems  Constitutional: Negative for fatigue.  HENT: Negative for mouth sores, mouth dryness and nose dryness.   Eyes: Negative for pain, itching and dryness.  Respiratory: Negative for shortness of breath and difficulty breathing.   Cardiovascular: Negative for chest pain and palpitations.  Gastrointestinal: Negative for blood in stool, constipation and diarrhea.  Endocrine: Negative for increased urination.  Genitourinary: Negative for difficulty urinating.  Musculoskeletal: Negative for  arthralgias, joint pain, joint swelling, myalgias, morning stiffness, muscle tenderness and myalgias.  Skin: Negative for color change, rash and redness.  Allergic/Immunologic: Negative for susceptible to infections.  Neurological: Negative for dizziness, numbness, headaches, memory loss and weakness.  Hematological: Negative for bruising/bleeding tendency.  Psychiatric/Behavioral: Negative for confusion.    PMFS History:  Patient Active Problem List   Diagnosis Date Noted  . Vitamin D deficiency 12/17/2016  . High risk medication use 12/16/2016  . Uveitis 12/16/2016  . Sarcoidosis 10/18/2014    Past Medical History:  Diagnosis Date  . Abnormal chest x-ray 3 weeks ago  . Bulging lumbar disc    L4, L5  . Sarcoidosis     Family History  Problem Relation Age of Onset  . Diabetes Mother   . Heart attack Father   . Kidney disease Brother   . Healthy Daughter   . Healthy Daughter   . Healthy Daughter    Past Surgical History:  Procedure Laterality Date  . CATARACT EXTRACTION Left   . ENDOBRONCHIAL ULTRASOUND Bilateral 11/14/2014   Procedure: ENDOBRONCHIAL ULTRASOUND;  Surgeon: 01/14/2015, MD;  Location: [facility];  Service: Endoscopy;  Laterality: Bilateral;  . INGUINAL HERNIA REPAIR Bilateral 04/04/2016   Procedure: LAPAROSCOPIC BILATERAL INGUINAL HERNIA REPAIR;  Surgeon: 04/06/2016, MD;  Location: [facility];  Service: General;  Laterality: Bilateral;  . INSERTION OF MESH Bilateral 04/04/2016   Procedure: INSERTION OF MESH;  Surgeon: 04/06/2016, MD;  Location: [facility];  Service: General;  Laterality: Bilateral;  . REFRACTIVE SURGERY  2002  . TONSILLECTOMY  age 24   Social History   Social History Narrative  . Not on file   Immunization History  Administered Date(s) Administered  . Influenza Split 06/24/2015  . Influenza,inj,Quad PF,6+ Mos 06/03/2016  . Influenza-Unspecified 07/10/2014  . PFIZER(Purple Top)SARS-COV-2 Vaccination 12/04/2019, 12/12/2019,  08/05/2020     Objective: Vital Signs: BP 107/73 (BP Location: Left Arm, Patient Position: Sitting, Cuff Size: Normal)   Pulse (!) 125   Resp 17   Ht 6\' 1"  (1.854 m)   Wt 194 lb (88 kg)   BMI 25.60 kg/m    Physical Exam Vitals and nursing note reviewed.  Constitutional:      Appearance: He is well-developed and well-nourished.  HENT:     Head: Normocephalic and atraumatic.  Eyes:     Extraocular Movements: EOM normal.     Conjunctiva/sclera: Conjunctivae normal.     Pupils: Pupils are equal, round, and reactive to light.  Pulmonary:     Effort: Pulmonary effort is normal.  Abdominal:     General: Bowel sounds are normal.     Palpations: Abdomen is soft.  Musculoskeletal:     Cervical back: Normal range of motion and neck supple.  Skin:    General: Skin is warm and dry.     Capillary Refill: Capillary refill takes less than 2 seconds.  Neurological:     Mental Status: He is alert and oriented to person, place, and time.  Psychiatric:        Mood and Affect: Mood and affect normal.        Behavior: Behavior normal.      Musculoskeletal Exam: C-spine, thoracic spine, lumbar spine have good range of motion with no discomfort.  Shoulder joints, elbow joints, wrist joints, MCPs, PIPs, DIPs have good range of motion with no synovitis.  He is able to make a complete fist bilaterally.  Hip joints have good range of motion with no discomfort.  Knee joints have good range of motion with no warmth or effusion.  Ankle joints have good range of motion with no tenderness or inflammation.  CDAI Exam: CDAI Score: -- Patient Global: --; Provider Global: -- Swollen: --; Tender: -- Joint Exam 10/30/2020   No joint exam has been documented for this visit   There is currently no information documented on the homunculus. Go to the Rheumatology activity and complete the homunculus joint exam.  Investigation: No additional findings.  Imaging: No results found.  Recent Labs: Lab  Results  Component Value Date   WBC 5.9 05/29/2020   HGB 13.6 05/29/2020   PLT 196 05/29/2020   NA 139 05/29/2020   K 4.6 05/29/2020   CL 103 05/29/2020   CO2 29 05/29/2020   GLUCOSE 115 (H) 05/29/2020   BUN 21 05/29/2020   CREATININE 1.17 05/29/2020   BILITOT 0.4 05/29/2020   ALKPHOS 65 12/23/2016   AST 19 05/29/2020   ALT 20 05/29/2020   PROT 6.9 05/29/2020   ALBUMIN 4.3 12/23/2016   CALCIUM 9.4 05/29/2020   GFRAA 82 05/29/2020    Speciality Comments: No specialty comments available.  Procedures:  No procedures performed Allergies: Bee venom   Assessment / Plan:     Visit Diagnoses: Sarcoidosis: He has not had any signs or symptoms of a flare recently.  He has not developed any new or worsening pulmonary symptoms.  He was encouraged to schedule a routine follow-up with his pulmonologist to  update PFTs as previously recommended.  He continues to exercise on a regular basis without difficulty.  He does not have any signs or symptoms of inflammatory arthritis.  He has no joint tenderness or synovitis on exam.  He has not had any recent uveitis flares.  He continues to follow-up with Dr. Allyne Gee at [facility] as recommended.  In December 2021 Dr. Allyne Gee supported reducing the dose of methotrexate due to his uveitis being completely suppressed.  Dr. Corliss Skains advised the patient to reduce MTX to 5 mg/wk. according to the patient he cut the methotrexate tablets for 5 mg tablets instead of 2.5 mg so he has only been taking 1 tablet/week for the past 2 months.  He has not noticed any new or worsening symptoms on the reduced dose of methotrexate.  He was advised to start taking methotrexate 2.5 mg 2 tablets by mouth once weekly and continue folic acid 1 mg by mouth daily.  He was advised to notify us if he develops any new or worsening symptoms. He will follow up in the office in 5 months.   Uveitis: He has not had any signs or symptoms of a flare. He is not experiencing any  eye pain, photophobia, floaters, dryness, or conjunctival injection.   He has been followed closely by Dr. Allyne Gee who supported reducing the dose of methotrexate.  On 08/28/2020 Dr. Corliss Skains advised the patient to reduce to 5 mg/wk (uveitis completely suppressed on 7.5mg /wk).  He has been taking methotrexate 2.5 mg 1 tablet by mouth once weekly for the past 2 months.  According to the patient he thought that the methotrexate tablets were 5 mg instead of 2.5 mg so he has not been taking 2 tablets weekly as prescribed.  He has been tolerating methotrexate without any side effects.  He has not developed any new or worsening symptoms on the reduced dose of methotrexate.  He was advised to increase the methotrexate dose to 2.5 mg 2 tablets by mouth once weekly and continue folic acid 1 mg by mouth daily.  A refill of methotrexate was sent to the pharmacy today.  He was advised to notify us if he develops any new or worsening symptoms.  High risk medication use -Methotrexate 2.5 mg 2 tablets by mouth once weekly and folic acid 1 mg daily.  CBC and CMP updated on 05/29/20.  He is overdue to update lab work.  Orders for CBC and CMP were released.  His next lab work will be due in May and every 3 months.  Standing orders for CBC and CMP were placed today.   - Plan: COMPLETE METABOLIC PANEL WITH GFR, CBC with Differential/Platelet He has not had any recent infections.  We discussed the importance of holding methotrexate if he develops signs or symptoms of an infection and to resume once the infection has completely cleared. He has received 3 Pfizer COVID-19 vaccine doses.  We discussed that ACR recommends receiving the fourth dose 5 months after his third dose.  He was advised to hold methotrexate 1 week after the fourth vaccine.  He was advised to avoid taking Tylenol and NSAIDs 24 hours prior to the vaccine. He plans on receiving the Shingrix vaccine.  History of cataract extraction: He has an appointment scheduled  with his ophthalmologist later today for routine follow up.   Vitamin D deficiency: He is taking vitamin D 2,000 units daily.   Orders: Orders Placed This Encounter  Procedures  . COMPLETE METABOLIC PANEL WITH GFR  .  CBC with Differential/Platelet  . CBC with Differential/Platelet  . COMPLETE METABOLIC PANEL WITH GFR   Meds ordered this encounter  Medications  . methotrexate (RHEUMATREX) 2.5 MG tablet    Sig: Take 2 tablets (5 mg total) by mouth once a week. Caution:Chemotherapy. Protect from light.    Dispense:  24 tablet    Refill:  0     Follow-Up Instructions: Return in about 5 months (around 03/29/2021) for Sarcoidosis, Uveitis.   Gearldine Bienenstock, PA-C  Note - This record has been created using Dragon software.  Chart creation errors have been sought, but may not always  have been located. Such creation errors do not reflect on  the standard of medical care.

## 2020-10-30 ENCOUNTER — Ambulatory Visit (INDEPENDENT_AMBULATORY_CARE_PROVIDER_SITE_OTHER): Payer: BC Managed Care – PPO | Admitting: Physician Assistant

## 2020-10-30 ENCOUNTER — Other Ambulatory Visit: Payer: Self-pay

## 2020-10-30 ENCOUNTER — Encounter: Payer: Self-pay | Admitting: Physician Assistant

## 2020-10-30 VITALS — BP 107/73 | HR 125 | Resp 17 | Ht 73.0 in | Wt 194.0 lb

## 2020-10-30 DIAGNOSIS — D869 Sarcoidosis, unspecified: Secondary | ICD-10-CM | POA: Diagnosis not present

## 2020-10-30 DIAGNOSIS — Z79899 Other long term (current) drug therapy: Secondary | ICD-10-CM

## 2020-10-30 DIAGNOSIS — H209 Unspecified iridocyclitis: Secondary | ICD-10-CM | POA: Diagnosis not present

## 2020-10-30 DIAGNOSIS — E559 Vitamin D deficiency, unspecified: Secondary | ICD-10-CM

## 2020-10-30 DIAGNOSIS — H25041 Posterior subcapsular polar age-related cataract, right eye: Secondary | ICD-10-CM | POA: Diagnosis not present

## 2020-10-30 DIAGNOSIS — Z9849 Cataract extraction status, unspecified eye: Secondary | ICD-10-CM | POA: Diagnosis not present

## 2020-10-30 DIAGNOSIS — H5213 Myopia, bilateral: Secondary | ICD-10-CM | POA: Diagnosis not present

## 2020-10-30 LAB — COMPLETE METABOLIC PANEL WITH GFR
AG Ratio: 1.8 (calc) (ref 1.0–2.5)
ALT: 18 U/L (ref 9–46)
AST: 19 U/L (ref 10–35)
Albumin: 4.5 g/dL (ref 3.6–5.1)
Alkaline phosphatase (APISO): 50 U/L (ref 35–144)
BUN: 16 mg/dL (ref 7–25)
CO2: 30 mmol/L (ref 20–32)
Calcium: 9.8 mg/dL (ref 8.6–10.3)
Chloride: 102 mmol/L (ref 98–110)
Creat: 1.08 mg/dL (ref 0.70–1.33)
GFR, Est African American: 90 mL/min/{1.73_m2} (ref >=60)
GFR, Est Non African American: 78 mL/min/{1.73_m2} (ref >=60)
Globulin: 2.5 g/dL (calc) (ref 1.9–3.7)
Glucose, Bld: 87 mg/dL (ref 65–99)
Potassium: 4.3 mmol/L (ref 3.5–5.3)
Sodium: 138 mmol/L (ref 135–146)
Total Bilirubin: 0.5 mg/dL (ref 0.2–1.2)
Total Protein: 7 g/dL (ref 6.1–8.1)

## 2020-10-30 LAB — CBC WITH DIFFERENTIAL/PLATELET
Absolute Monocytes: 542 cells/uL (ref 200–950)
Basophils Absolute: 63 cells/uL (ref 0–200)
Basophils Relative: 1 %
Eosinophils Absolute: 158 cells/uL (ref 15–500)
Eosinophils Relative: 2.5 %
HCT: 38.8 % (ref 38.5–50.0)
Hemoglobin: 13.6 g/dL (ref 13.2–17.1)
Lymphs Abs: 2255 cells/uL (ref 850–3900)
MCH: 31.6 pg (ref 27.0–33.0)
MCHC: 35.1 g/dL (ref 32.0–36.0)
MCV: 90.2 fL (ref 80.0–100.0)
MPV: 9.1 fL (ref 7.5–12.5)
Monocytes Relative: 8.6 %
Neutro Abs: 3282 cells/uL (ref 1500–7800)
Neutrophils Relative %: 52.1 %
Platelets: 221 10*3/uL (ref 140–400)
RBC: 4.3 10*6/uL (ref 4.20–5.80)
RDW: 12.2 % (ref 11.0–15.0)
Total Lymphocyte: 35.8 %
WBC: 6.3 10*3/uL (ref 3.8–10.8)

## 2020-10-30 MED ORDER — METHOTREXATE 2.5 MG PO TABS
5.0000 mg | ORAL_TABLET | ORAL | 0 refills | Status: DC
Start: 2020-10-30 — End: 2021-03-26

## 2020-10-31 NOTE — Progress Notes (Signed)
CBC and CMP WNL

## 2021-03-13 NOTE — Progress Notes (Signed)
Office Visit Note  Patient: Alexander Nelson             Date of Birth: [birth year]           MRN: [id number]             PCP: Brynda Peon, MD Referring: Brynda Peon, MD Visit Date: 03/27/2021 Occupation: @GUAROCC @  Subjective:  Pain of the Right Hand and Pain of the Left Hand   History of Present Illness: Rolly Magri is a 55 y.o. male with history of sarcoidosis and uveitis.  He states he has been on methotrexate 2 tablets/week.  He had been seeing Dr. 57 every 6 months and has not had a recurrence of uveitis.  He denies any shortness of breath.  He states he recently had an injury and had chest x-ray and CT scan of his chest by his PCP which did not show any changes.  He has been working out on a daily basis.  He has been experiencing some stiffness in his right hand which she describes over the right second MCP and right second PIP joint.  He has not noticed any joint swelling.  Activities of Daily Living:  Patient reports morning stiffness for 0 minute.   Patient Denies nocturnal pain.  Difficulty dressing/grooming: Denies Difficulty climbing stairs: Denies Difficulty getting out of chair: Denies Difficulty using hands for taps, buttons, cutlery, and/or writing: Denies  Review of Systems  Constitutional:  Negative for fatigue and night sweats.  HENT:  Negative for mouth sores, mouth dryness and nose dryness.   Eyes:  Negative for redness and dryness.  Respiratory:  Negative for shortness of breath and difficulty breathing.   Cardiovascular:  Negative for chest pain, palpitations, hypertension, irregular heartbeat and swelling in legs/feet.  Gastrointestinal:  Negative for constipation and diarrhea.  Endocrine: Negative for increased urination.  Musculoskeletal:  Positive for joint pain and joint pain. Negative for joint swelling, myalgias, muscle weakness, morning stiffness, muscle tenderness and myalgias.  Skin:  Negative for color change, rash, hair loss,  nodules/bumps, skin tightness, ulcers and sensitivity to sunlight.  Allergic/Immunologic: Negative for susceptible to infections.  Neurological:  Negative for dizziness, fainting, memory loss, night sweats and weakness ( ).  Hematological:  Negative for swollen glands.  Psychiatric/Behavioral:  Negative for depressed mood and sleep disturbance. The patient is not nervous/anxious.    PMFS History:  Patient Active Problem List   Diagnosis Date Noted   Vitamin D deficiency 12/17/2016   High risk medication use 12/16/2016   Uveitis 12/16/2016   Sarcoidosis 10/18/2014    Past Medical History:  Diagnosis Date   Abnormal chest x-ray 3 weeks ago   Bulging lumbar disc    L4, L5   Sarcoidosis     Family History  Problem Relation Age of Onset   Diabetes Mother    Heart attack Father    Kidney disease Brother    Healthy Daughter    Healthy Daughter    Healthy Daughter    Past Surgical History:  Procedure Laterality Date   CATARACT EXTRACTION Left    ENDOBRONCHIAL ULTRASOUND Bilateral 11/14/2014   Procedure: ENDOBRONCHIAL ULTRASOUND;  Surgeon: 01/14/2015, MD;  Location: [facility];  Service: Endoscopy;  Laterality: Bilateral;   INGUINAL HERNIA REPAIR Bilateral 04/04/2016   Procedure: LAPAROSCOPIC BILATERAL INGUINAL HERNIA REPAIR;  Surgeon: 04/06/2016, MD;  Location: [facility];  Service: General;  Laterality: Bilateral;   INSERTION OF MESH Bilateral 04/04/2016   Procedure: INSERTION OF MESH;  Surgeon: Avel Peace, MD;  Location: [facility];  Service: General;  Laterality: Bilateral;   REFRACTIVE SURGERY  2002   TONSILLECTOMY  age 10   Social History   Social History Narrative   Not on file   Immunization History  Administered Date(s) Administered   Influenza Split 06/24/2015   Influenza,inj,Quad PF,6+ Mos 06/03/2016   Influenza-Unspecified 07/10/2014   PFIZER(Purple Top)SARS-COV-2 Vaccination 12/04/2019, 12/12/2019, 08/05/2020     Objective: Vital Signs: BP (!) 92/59 (BP  Location: Left Arm, Patient Position: Sitting, Cuff Size: Large)   Pulse (!) 52   Resp 12   Ht 6\' 1"  (1.854 m)   Wt 194 lb (88 kg)   BMI 25.60 kg/m    Physical Exam Vitals and nursing note reviewed.  Constitutional:      Appearance: He is well-developed.  HENT:     Head: Normocephalic and atraumatic.  Eyes:     Conjunctiva/sclera: Conjunctivae normal.     Pupils: Pupils are equal, round, and reactive to light.  Cardiovascular:     Rate and Rhythm: Normal rate and regular rhythm.     Heart sounds: Normal heart sounds.  Pulmonary:     Effort: Pulmonary effort is normal.     Breath sounds: Normal breath sounds.  Abdominal:     General: Bowel sounds are normal.     Palpations: Abdomen is soft.  Musculoskeletal:     Cervical back: Normal range of motion and neck supple.  Skin:    General: Skin is warm and dry.     Capillary Refill: Capillary refill takes less than 2 seconds.  Neurological:     Mental Status: He is alert and oriented to person, place, and time.  Psychiatric:        Behavior: Behavior normal.     Musculoskeletal Exam: C-spine was in good range of motion.  Shoulder joints, elbow joints, wrist joints, MCPs PIPs and DIPs with good range of motion.  He has some PIP and DIP thickening with no synovitis.  Hip joints, knee joints, ankles with good range of motion.  He had no tenderness over ankles or MTPs.  CDAI Exam: CDAI Score: -- Patient Global: --; Provider Global: -- Swollen: --; Tender: -- Joint Exam 03/27/2021   No joint exam has been documented for this visit   There is currently no information documented on the homunculus. Go to the Rheumatology activity and complete the homunculus joint exam.  Investigation: No additional findings.  Imaging: No results found.  Recent Labs: Lab Results  Component Value Date   WBC 6.3 10/30/2020   HGB 13.6 10/30/2020   PLT 221 10/30/2020   NA 138 10/30/2020   K 4.3 10/30/2020   CL 102 10/30/2020   CO2 30  10/30/2020   GLUCOSE 87 10/30/2020   BUN 16 10/30/2020   CREATININE 1.08 10/30/2020   BILITOT 0.5 10/30/2020   ALKPHOS 65 12/23/2016   AST 19 10/30/2020   ALT 18 10/30/2020   PROT 7.0 10/30/2020   ALBUMIN 4.3 12/23/2016   CALCIUM 9.8 10/30/2020   GFRAA 90 10/30/2020    Speciality Comments: No specialty comments available.  Procedures:  No procedures performed Allergies: Bee venom   Assessment / Plan:     Visit Diagnoses: Sarcoidosis -he denies any shortness of breath.  He has been active and exercising on a regular basis.  He reports having recent chest x-ray and CT scan by his PCP which was stable.  I do not have the reports available.  Uveitis -  He has been followed closely by Dr. Allyne Gee who supported reducing the dose of methotrexate.  Patient denies any flares of uveitis.  He has an appointment coming up with Dr. Juan Quam is on methotrexate 2 tablets p.o. weekly which is a very low-dose.     High risk medication use - Methotrexate 2.5 mg 2 tablets by mouth once weekly and folic acid 1 mg daily. - Plan: CBC with Differential/Platelet, COMPLETE METABOLIC PANEL WITH GFR today and then every 3 months to monitor for drug toxicity.He states he had recent COVID-19 infection.  Have advised him to get booster after 3 months.  Instructions were placed in the AVS.  He was advised to delay methotrexate by 1 week after the booster.  Pain in right hand-he complains of discomfort in his right hand second MCP and PIP joints.  No synovitis was noted.  He has some thickening of the right PIP joint which could be Ro consistent with early osteoarthritis.  I have advised him to contact us in case he develops increased joint swelling.  Vitamin D deficiency-he is currently taking vitamin D 2000 units daily.  History of cataract extraction  Orders: Orders Placed This Encounter  Procedures   CBC with Differential/Platelet   COMPLETE METABOLIC PANEL WITH GFR    No orders of the defined types  were placed in this encounter.    Follow-Up Instructions: Return in about 5 months (around 08/27/2021) for Sarcoidosis.   Pollyann Savoy, MD  Note - This record has been created using Animal nutritionist.  Chart creation errors have been sought, but may not always  have been located. Such creation errors do not reflect on  the standard of medical care.

## 2021-03-26 ENCOUNTER — Other Ambulatory Visit: Payer: Self-pay | Admitting: *Deleted

## 2021-03-26 DIAGNOSIS — H209 Unspecified iridocyclitis: Secondary | ICD-10-CM

## 2021-03-26 DIAGNOSIS — D869 Sarcoidosis, unspecified: Secondary | ICD-10-CM

## 2021-03-26 DIAGNOSIS — Z79899 Other long term (current) drug therapy: Secondary | ICD-10-CM

## 2021-03-26 MED ORDER — METHOTREXATE 2.5 MG PO TABS: 5.0000 mg | ORAL_TABLET | ORAL | 0 refills | Status: DC

## 2021-03-26 NOTE — Telephone Encounter (Addendum)
Next Visit: 03/27/2021  Last Visit: 10/30/2020  Last Fill: 10/30/2020  DX: Sarcoidosis  Current Dose per office note 10/30/2020: Methotrexate 2.5 mg 2 tablets by mouth once weekly   Labs: 10/30/2020, CBC and CMP WNL  Okay to refill MTX?

## 2021-03-26 NOTE — Telephone Encounter (Signed)
Please advise the patient to update lab work.  His labs were due in May.

## 2021-03-26 NOTE — Telephone Encounter (Signed)
LMOM that we will draw labs at appt on 03/27/2021

## 2021-03-27 ENCOUNTER — Other Ambulatory Visit: Payer: Self-pay

## 2021-03-27 ENCOUNTER — Ambulatory Visit (INDEPENDENT_AMBULATORY_CARE_PROVIDER_SITE_OTHER): Payer: BC Managed Care – PPO | Admitting: Rheumatology

## 2021-03-27 ENCOUNTER — Encounter: Payer: Self-pay | Admitting: Rheumatology

## 2021-03-27 VITALS — BP 92/59 | HR 52 | Resp 12 | Ht 73.0 in | Wt 194.0 lb

## 2021-03-27 DIAGNOSIS — M79641 Pain in right hand: Secondary | ICD-10-CM

## 2021-03-27 DIAGNOSIS — Z79899 Other long term (current) drug therapy: Secondary | ICD-10-CM

## 2021-03-27 DIAGNOSIS — D869 Sarcoidosis, unspecified: Secondary | ICD-10-CM

## 2021-03-27 DIAGNOSIS — Z9849 Cataract extraction status, unspecified eye: Secondary | ICD-10-CM

## 2021-03-27 DIAGNOSIS — E559 Vitamin D deficiency, unspecified: Secondary | ICD-10-CM | POA: Diagnosis not present

## 2021-03-27 DIAGNOSIS — H209 Unspecified iridocyclitis: Secondary | ICD-10-CM | POA: Diagnosis not present

## 2021-03-27 LAB — CBC WITH DIFFERENTIAL/PLATELET
Absolute Monocytes: 619 cells/uL (ref 200–950)
Basophils Absolute: 31 cells/uL (ref 0–200)
Basophils Relative: 0.6 %
Eosinophils Absolute: 348 cells/uL (ref 15–500)
Eosinophils Relative: 6.7 %
HCT: 39.3 % (ref 38.5–50.0)
Hemoglobin: 13.5 g/dL (ref 13.2–17.1)
Lymphs Abs: 1971 cells/uL (ref 850–3900)
MCH: 31 pg (ref 27.0–33.0)
MCHC: 34.4 g/dL (ref 32.0–36.0)
MCV: 90.1 fL (ref 80.0–100.0)
MPV: 9.3 fL (ref 7.5–12.5)
Monocytes Relative: 11.9 %
Neutro Abs: 2231 cells/uL (ref 1500–7800)
Neutrophils Relative %: 42.9 %
Platelets: 195 10*3/uL (ref 140–400)
RBC: 4.36 10*6/uL (ref 4.20–5.80)
RDW: 12.6 % (ref 11.0–15.0)
Total Lymphocyte: 37.9 %
WBC: 5.2 10*3/uL (ref 3.8–10.8)

## 2021-03-27 LAB — COMPLETE METABOLIC PANEL WITH GFR
AG Ratio: 1.5 (calc) (ref 1.0–2.5)
ALT: 22 U/L (ref 9–46)
AST: 21 U/L (ref 10–35)
Albumin: 4.3 g/dL (ref 3.6–5.1)
Alkaline phosphatase (APISO): 55 U/L (ref 35–144)
BUN: 18 mg/dL (ref 7–25)
CO2: 32 mmol/L (ref 20–32)
Calcium: 9.8 mg/dL (ref 8.6–10.3)
Chloride: 101 mmol/L (ref 98–110)
Creat: 1.18 mg/dL (ref 0.70–1.30)
Globulin: 2.9 g/dL (calc) (ref 1.9–3.7)
Glucose, Bld: 102 mg/dL — ABNORMAL HIGH (ref 65–99)
Potassium: 4.4 mmol/L (ref 3.5–5.3)
Sodium: 138 mmol/L (ref 135–146)
Total Bilirubin: 0.5 mg/dL (ref 0.2–1.2)
Total Protein: 7.2 g/dL (ref 6.1–8.1)
eGFR: 73 mL/min/{1.73_m2} (ref >=60)

## 2021-03-27 NOTE — Patient Instructions (Addendum)
Standing Labs We placed an order today for your standing lab work.   Please have your standing labs drawn in October and every 3 months  If possible, please have your labs drawn 2 weeks prior to your appointment so that the provider can discuss your results at your appointment.  Please note that you may see your imaging and lab results in MyChart before we have reviewed them. We may be awaiting multiple results to interpret others before contacting you. Please allow our office up to 72 hours to thoroughly review all of the results before contacting the office for clarification of your results.  We have open lab daily: Monday through Thursday from 1:30-4:30 PM and Friday from 1:30-4:00 PM at the office of Dr. Pollyann Savoy, [facility].   Please be advised, all patients with office appointments requiring lab work will take precedent over walk-in lab work.  If possible, please come for your lab work on Monday and Friday afternoons, as you may experience shorter wait times. The office is located at [address], [place], [place] [address] No appointment is necessary.   Labs are drawn by [facility]. Please bring your co-pay at the time of your lab draw.  You may receive a bill from [facility] for your lab work.  If you wish to have your labs drawn at another location, please call the office 24 hours in advance to send orders.  If you have any questions regarding directions or hours of operation,  please call [phone].   As a reminder, please drink plenty of water prior to coming for your lab work. Thanks!   Vaccines You are taking a medication(s) that can suppress your immune system.  The following immunizations are recommended: Flu annually Covid-19  Td/Tdap (tetanus, diphtheria, pertussis) every 10 years Pneumonia (Prevnar 15 then Pneumovax 23 at least 1 year apart.  Alternatively, can take Prevnar 20 without needing additional dose) Shingrix (after age 55): 2  doses from 4 weeks to 6 months apart  Please check with your PCP to make sure you are up to date.   If you test POSITIVE for COVID19 and have MILD to MODERATE symptoms: First, call your PCP if you would like to receive COVID19 treatment AND Hold your medications during the infection and for at least 1 week after your symptoms have resolved: Injectable medication (Benlysta, Cimzia, Cosentyx, Enbrel, Humira, Orencia, Remicade, Simponi, Stelara, Taltz, Tremfya) Methotrexate Leflunomide (Arava) Mycophenolate (Cellcept) Harriette Ohara, Olumiant, or Rinvoq If you take Actemra or Kevzara, you DO NOT need to hold these for COVID19 infection.  If you test POSITIVE for COVID19 and have NO symptoms: First, call your PCP if you would like to receive COVID19 treatment AND Hold your medications for at least 10 days after the day that you tested positive Injectable medication (Benlysta, Cimzia, Cosentyx, Enbrel, Humira, Orencia, Remicade, Simponi, Stelara, Taltz, Tremfya) Methotrexate Leflunomide (Arava) Mycophenolate (Cellcept) Harriette Ohara, Olumiant, or Rinvoq If you take Actemra or Kevzara, you DO NOT need to hold these for COVID19 infection.  If you have signs or symptoms of an infection or start antibiotics: First, call your PCP for workup of your infection. Hold your medication through the infection, until you complete your antibiotics, and until symptoms resolve if you take the following: Injectable medication (Actemra, Benlysta, Cimzia, Cosentyx, Enbrel, Humira, Kevzara, Orencia, Remicade, Simponi, Stelara, Taltz, Tremfya) Methotrexate Leflunomide (Arava) Mycophenolate (Cellcept) Harriette Ohara, Olumiant, or Rinvoq  COVID-19 vaccine recommendations:   COVID-19 vaccine is recommended for everyone (unless you are allergic to  a vaccine component), even if you are on a medication that suppresses your immune system.   If you are on Methotrexate, Cellcept (mycophenolate), Rinvoq, Harriette Ohara, and Olumiant- hold the  medication for 1 week after each vaccine. Hold Methotrexate for 2 weeks after the single dose COVID-19 vaccine.   If you are on Orencia subcutaneous injection - hold medication one week prior to and one week after the first COVID-19 vaccine dose (only).   If you are on Orencia IV infusions- time vaccination administration so that the first COVID-19 vaccination will occur four weeks after the infusion and postpone the subsequent infusion by one week.   If you are on Cyclophosphamide or Rituxan infusions please contact your doctor prior to receiving the COVID-19 vaccine.   Do not take Tylenol or any anti-inflammatory medications (NSAIDs) 24 hours prior to the COVID-19 vaccination.   There is no direct evidence about the efficacy of the COVID-19 vaccine in individuals who are on medications that suppress the immune system.   Even if you are fully vaccinated, and you are on any medications that suppress your immune system, please continue to wear a mask, maintain at least six feet social distance and practice hand hygiene.   If you develop a COVID-19 infection, please contact your PCP or our office to determine if you need monoclonal antibody infusion.  The booster vaccine is now available for immunocompromised patients.   Please see the following web sites for updated information.   [email or web address]

## 2021-03-28 ENCOUNTER — Telehealth: Payer: Self-pay

## 2021-03-28 NOTE — Telephone Encounter (Signed)
Re-faxed note and labs results.

## 2021-03-28 NOTE — Telephone Encounter (Signed)
Dr. Karmen Stabs office called stating they only received 1/4 of each page from the office note that was faxed to their office.  Please re-fax to #[phone] If you have any questions, please call back at #[phone]

## 2021-03-29 DIAGNOSIS — Z79899 Other long term (current) drug therapy: Secondary | ICD-10-CM | POA: Diagnosis not present

## 2021-03-29 DIAGNOSIS — D869 Sarcoidosis, unspecified: Secondary | ICD-10-CM | POA: Diagnosis not present

## 2021-03-29 DIAGNOSIS — E559 Vitamin D deficiency, unspecified: Secondary | ICD-10-CM | POA: Diagnosis not present

## 2021-03-29 DIAGNOSIS — D849 Immunodeficiency, unspecified: Secondary | ICD-10-CM | POA: Diagnosis not present

## 2021-03-29 DIAGNOSIS — Z0000 Encounter for general adult medical examination without abnormal findings: Secondary | ICD-10-CM | POA: Diagnosis not present

## 2021-04-04 DIAGNOSIS — H43813 Vitreous degeneration, bilateral: Secondary | ICD-10-CM | POA: Diagnosis not present

## 2021-04-04 DIAGNOSIS — H35373 Puckering of macula, bilateral: Secondary | ICD-10-CM | POA: Diagnosis not present

## 2021-04-04 DIAGNOSIS — H43392 Other vitreous opacities, left eye: Secondary | ICD-10-CM | POA: Diagnosis not present

## 2021-04-04 DIAGNOSIS — D8683 Sarcoid iridocyclitis: Secondary | ICD-10-CM | POA: Diagnosis not present

## 2021-04-11 ENCOUNTER — Telehealth: Payer: Self-pay

## 2021-04-11 DIAGNOSIS — Z20822 Contact with and (suspected) exposure to covid-19: Secondary | ICD-10-CM | POA: Diagnosis not present

## 2021-04-11 NOTE — Telephone Encounter (Signed)
Patient advised he should discontinue methotrexate until the results of COVID test are available.  Patient advised if he is COVID-positive he should hold off methotrexate.  He may restart methotrexate 2 weeks after he becomes asymptomatic.  Methotrexate does not interfere with Paxlovid prescription. Patient expressed understanding.

## 2021-04-11 NOTE — Telephone Encounter (Signed)
Patient called stating he is waiting for the results of his Covid test that he took today.  Patient states his PCP wanted him to check with Dr. Corliss Skains to make sure it is okay for them to prescribe Paxlovid since he is currently taking Methotrexate.  Patient states he has headache and fatigue.  Patient requested a return call.

## 2021-04-11 NOTE — Telephone Encounter (Signed)
He should discontinue methotrexate until the results of COVID test are available.  If he is COVID-positive he should hold off methotrexate.  He may restart methotrexate 2 weeks after he becomes asymptomatic.  Methotrexate does not interfere with Paxlovid prescription.

## 2021-05-16 DIAGNOSIS — Z1211 Encounter for screening for malignant neoplasm of colon: Secondary | ICD-10-CM | POA: Diagnosis not present

## 2021-05-16 DIAGNOSIS — D126 Benign neoplasm of colon, unspecified: Secondary | ICD-10-CM | POA: Diagnosis not present

## 2021-05-16 DIAGNOSIS — Z86010 Personal history of colonic polyps: Secondary | ICD-10-CM | POA: Diagnosis not present

## 2021-06-26 DIAGNOSIS — Z23 Encounter for immunization: Secondary | ICD-10-CM | POA: Diagnosis not present

## 2021-07-02 ENCOUNTER — Other Ambulatory Visit: Payer: Self-pay | Admitting: Physician Assistant

## 2021-07-02 DIAGNOSIS — D869 Sarcoidosis, unspecified: Secondary | ICD-10-CM

## 2021-07-02 DIAGNOSIS — Z79899 Other long term (current) drug therapy: Secondary | ICD-10-CM

## 2021-07-02 DIAGNOSIS — H209 Unspecified iridocyclitis: Secondary | ICD-10-CM

## 2021-07-03 NOTE — Telephone Encounter (Signed)
Next Visit: 08/28/2021  Last Visit: 03/27/2021  Last Fill: 03/26/2021  DX: Sarcoidosis  Current Dose per office note 03/27/2021: Methotrexate 2.5 mg 2 tablets by mouth once weekly  Labs: 03/27/2021 CBC and CMP WNL  Patient advised he is due for labs. Lab Orders faxed to PCP.   Okay to refill MTX?

## 2021-07-10 DIAGNOSIS — N529 Male erectile dysfunction, unspecified: Secondary | ICD-10-CM | POA: Diagnosis not present

## 2021-08-06 ENCOUNTER — Telehealth: Payer: Self-pay

## 2021-08-06 DIAGNOSIS — D869 Sarcoidosis, unspecified: Secondary | ICD-10-CM | POA: Diagnosis not present

## 2021-08-06 NOTE — Telephone Encounter (Signed)
Amanda from Dr. Allene Pyo office left a voicemail stating patient came to our office this morning to have bloodwork drawn, but we do not have any orders.  Please fax orders to [phone] so we can process his bloodwork.  If you have any questions, please call back at #[phone]

## 2021-08-06 NOTE — Telephone Encounter (Signed)
Lab Orders faxed

## 2021-08-14 NOTE — Progress Notes (Signed)
Office Visit Note  Patient: Alexander Nelson             Date of Birth: 1966-07-09           MRN: [id number]             PCP: Brynda Peon, MD Referring: Brynda Peon, MD Visit Date: 08/28/2021 Occupation: @GUAROCC @  Subjective:  Medication monitoring   History of Present Illness: Alexander Nelson is a 55 y.o. male with history of sarcoidosis and uveitis. He is taking methotrexate 2 tablets by mouth once weekly and folic acid 1 mg daily.  He has not had any signs or symptoms of a sarcoidosis or uveitis flare.  He denies any new or worsening pulmonary symptoms. He denies any eye pain, photophobia, floaters, or eye redness.  He continues to see Dr. Allyne Gee on a regular basis as recommended.  He denies any increased joint pain or joint swelling.  He has not had any morning stiffness or nocturnal pain.  He remains active exercising on a regular basis.  He denies any recent infections.      Activities of Daily Living:  Patient reports morning stiffness for 0  none .   Patient Denies nocturnal pain.  Difficulty dressing/grooming: Denies Difficulty climbing stairs: Denies Difficulty getting out of chair: Denies Difficulty using hands for taps, buttons, cutlery, and/or writing: Denies  Review of Systems  Constitutional:  Negative for fatigue and night sweats.  HENT:  Negative for mouth sores, mouth dryness and nose dryness.   Eyes:  Negative for redness and dryness.  Respiratory:  Negative for shortness of breath and difficulty breathing.   Cardiovascular:  Negative for chest pain, palpitations, hypertension, irregular heartbeat and swelling in legs/feet.  Gastrointestinal:  Negative for constipation and diarrhea.  Endocrine: Negative for excessive thirst and increased urination.  Genitourinary:  Negative for difficulty urinating and painful urination.  Musculoskeletal:  Negative for joint pain, joint pain, joint swelling, myalgias, muscle weakness, morning stiffness, muscle  tenderness and myalgias.  Skin:  Negative for color change, rash, hair loss, nodules/bumps, skin tightness, ulcers and sensitivity to sunlight.  Allergic/Immunologic: Positive for susceptible to infections.  Neurological:  Negative for dizziness, fainting, numbness, memory loss, night sweats and weakness.  Hematological:  Negative for bruising/bleeding tendency and swollen glands.  Psychiatric/Behavioral:  Positive for sleep disturbance. Negative for depressed mood. The patient is not nervous/anxious.    PMFS History:  Patient Active Problem List   Diagnosis Date Noted   Vitamin D deficiency 12/17/2016   High risk medication use 12/16/2016   Uveitis 12/16/2016   Sarcoidosis 10/18/2014    Past Medical History:  Diagnosis Date   Abnormal chest x-ray 3 weeks ago   Bulging lumbar disc    L4, L5   Sarcoidosis     Family History  Problem Relation Age of Onset   Diabetes Mother    Heart attack Father    Kidney disease Brother    Healthy Daughter    Healthy Daughter    Healthy Daughter    Past Surgical History:  Procedure Laterality Date   CATARACT EXTRACTION Left    ENDOBRONCHIAL ULTRASOUND Bilateral 11/14/2014   Procedure: ENDOBRONCHIAL ULTRASOUND;  Surgeon: Barbaraann Share, MD;  Location: [facility];  Service: Endoscopy;  Laterality: Bilateral;   INGUINAL HERNIA REPAIR Bilateral 04/04/2016   Procedure: LAPAROSCOPIC BILATERAL INGUINAL HERNIA REPAIR;  Surgeon: Avel Peace, MD;  Location: [facility];  Service: General;  Laterality: Bilateral;   INSERTION OF MESH Bilateral 04/04/2016  Procedure: INSERTION OF MESH;  Surgeon: Jackolyn Confer, MD;  Location: [facility];  Service: General;  Laterality: Bilateral;   REFRACTIVE SURGERY  2002   TONSILLECTOMY  age 89   Social History   Social History Narrative   Not on file   Immunization History  Administered Date(s) Administered   Influenza Split 06/24/2015   Influenza,inj,Quad PF,6+ Mos 06/03/2016   Influenza-Unspecified 07/10/2014    PFIZER(Purple Top)SARS-COV-2 Vaccination 12/04/2019, 12/12/2019, 08/05/2020     Objective: Vital Signs: BP (!) 94/58 (BP Location: Left Arm, Patient Position: Sitting, Cuff Size: Normal)   Pulse (!) 58   Resp 15   Ht 6\' 1"  (1.854 m)   Wt 177 lb (80.3 kg)   BMI 23.35 kg/m    Physical Exam Vitals and nursing note reviewed.  Constitutional:      Appearance: He is well-developed.  HENT:     Head: Normocephalic and atraumatic.  Eyes:     Conjunctiva/sclera: Conjunctivae normal.     Pupils: Pupils are equal, round, and reactive to light.  Cardiovascular:     Rate and Rhythm: Normal rate and regular rhythm.     Heart sounds: Normal heart sounds.  Pulmonary:     Effort: Pulmonary effort is normal.     Breath sounds: Normal breath sounds.  Abdominal:     General: Bowel sounds are normal.     Palpations: Abdomen is soft.  Musculoskeletal:     Cervical back: Normal range of motion and neck supple.  Skin:    General: Skin is warm and dry.     Capillary Refill: Capillary refill takes less than 2 seconds.  Neurological:     Mental Status: He is alert and oriented to person, place, and time.  Psychiatric:        Behavior: Behavior normal.     Musculoskeletal Exam: C-spine, thoracic spine, lumbar spine have good range of motion with no discomfort.  Shoulder joints, elbow joints, wrist joints, MCPs, PIPs, DIPs have good range of motion with no synovitis.  Complete fist formation bilaterally.  Hip joints have good range of motion with no groin pain.  Knee joints have good range of motion with no warmth or effusion.  Ankle joints have good range of motion with no tenderness or joint swelling.  CDAI Exam: CDAI Score: -- Patient Global: --; Provider Global: -- Swollen: --; Tender: -- Joint Exam 08/28/2021   No joint exam has been documented for this visit   There is currently no information documented on the homunculus. Go to the Rheumatology activity and complete the homunculus joint  exam.  Investigation: No additional findings.  Imaging: No results found.  Recent Labs: Lab Results  Component Value Date   WBC 5.2 03/27/2021   HGB 13.5 03/27/2021   PLT 195 03/27/2021   NA 138 03/27/2021   K 4.4 03/27/2021   CL 101 03/27/2021   CO2 32 03/27/2021   GLUCOSE 102 (H) 03/27/2021   BUN 18 03/27/2021   CREATININE 1.18 03/27/2021   BILITOT 0.5 03/27/2021   ALKPHOS 65 12/23/2016   AST 21 03/27/2021   ALT 22 03/27/2021   PROT 7.2 03/27/2021   ALBUMIN 4.3 12/23/2016   CALCIUM 9.8 03/27/2021   GFRAA 90 10/30/2020    Speciality Comments: No specialty comments available.  Procedures:  No procedures performed Allergies: Bee venom   Assessment / Plan:     Visit Diagnoses: Sarcoidosis: He has not had any signs or symptoms of a sarcoidosis flare.  He has not seen  a pulmonologist in many years.  He is not experiencing any new or worsening pulmonary symptoms.  He has no shortness of breath, pleuritic chest pain, or cough.  His lungs were clear to auscultation on examination today.  He has no signs of inflammatory arthritis.  No joint tenderness or synovitis was noted.  He has not had any signs or symptoms of a uveitis flare.  He has not had any recent rashes or findings consistent with EN lesions.  He remains on low-dose methotrexate 2 tablets by mouth once weekly and folic acid 1 mg daily.  He is tolerating methotrexate without any side effects and has not missed any doses recently.  He was advised to notify us if he develops any new or worsening symptoms.  He will follow-up in the office in 6 months.  Uveitis - He has been followed closely by Dr. Allyne Gee who supported reducing the dose of methotrexate.  He has not had any signs or symptoms of a uveitis flare.  He is not experiencing any eye pain, photophobia, new floaters, or conjunctival injection.  He remains on low-dose methotrexate 2 tablets once weekly.  He has been tolerating low-dose methotrexate without any side  effects.  He will remain on the current dose.  He does not need a refill at this time.  He was advised to notify us if he develops signs or symptoms of a flare.  High risk medication use - Methotrexate 2.5 mg 2 tablets by mouth once weekly and folic acid 1 mg daily.  CBC and CMP updated at the end of November 2022.  We will call to obtain these records from his PCPs office.  His next lab work will be due in March and every 3 months.  Standing orders are in place.  He has not had any recent infections.  Discussed the importance of holding methotrexate if he develops signs or symptoms of an infection and to resume once the infection has completley cleared.    Pain in right hand: Resolved.  He has no joint tenderness or synovitis on examination today.  He was able to make a complete fist without difficulty.  Vitamin D deficiency - He is taking vitamin D 2000 units daily.  History of cataract extraction  Orders: No orders of the defined types were placed in this encounter.  No orders of the defined types were placed in this encounter.    Follow-Up Instructions: Return in about 6 months (around 02/26/2022) for Sarcoidosis, Uveitis.   Gearldine Bienenstock, PA-C  Note - This record has been created using Dragon software.  Chart creation errors have been sought, but may not always  have been located. Such creation errors do not reflect on  the standard of medical care.

## 2021-08-28 ENCOUNTER — Telehealth: Payer: Self-pay

## 2021-08-28 ENCOUNTER — Other Ambulatory Visit: Payer: Self-pay

## 2021-08-28 ENCOUNTER — Encounter: Payer: Self-pay | Admitting: Physician Assistant

## 2021-08-28 ENCOUNTER — Ambulatory Visit (INDEPENDENT_AMBULATORY_CARE_PROVIDER_SITE_OTHER): Payer: BC Managed Care – PPO | Admitting: Physician Assistant

## 2021-08-28 VITALS — BP 94/58 | HR 58 | Resp 15 | Ht 73.0 in | Wt 177.0 lb

## 2021-08-28 DIAGNOSIS — Z79899 Other long term (current) drug therapy: Secondary | ICD-10-CM

## 2021-08-28 DIAGNOSIS — Z9849 Cataract extraction status, unspecified eye: Secondary | ICD-10-CM

## 2021-08-28 DIAGNOSIS — D869 Sarcoidosis, unspecified: Secondary | ICD-10-CM | POA: Diagnosis not present

## 2021-08-28 DIAGNOSIS — E559 Vitamin D deficiency, unspecified: Secondary | ICD-10-CM

## 2021-08-28 DIAGNOSIS — M79641 Pain in right hand: Secondary | ICD-10-CM | POA: Diagnosis not present

## 2021-08-28 DIAGNOSIS — H209 Unspecified iridocyclitis: Secondary | ICD-10-CM | POA: Diagnosis not present

## 2021-08-28 NOTE — Patient Instructions (Addendum)
Standing Labs We placed an order today for your standing lab work.   Please have your standing labs drawn in early March and every 3 months   If possible, please have your labs drawn 2 weeks prior to your appointment so that the provider can discuss your results at your appointment.  Please note that you may see your imaging and lab results in MyChart before we have reviewed them. We may be awaiting multiple results to interpret others before contacting you. Please allow our office up to 72 hours to thoroughly review all of the results before contacting the office for clarification of your results.  We have open lab daily: Monday through Thursday from 1:30-4:30 PM and Friday from 1:30-4:00 PM at the office of Dr. Pollyann Savoy, [facility].   Please be advised, all patients with office appointments requiring lab work will take precedent over walk-in lab work.  If possible, please come for your lab work on Monday and Friday afternoons, as you may experience shorter wait times. The office is located at [address], [address], [place], [place] [address] No appointment is necessary.   Labs are drawn by [facility]. Please bring your co-pay at the time of your lab draw.  You may receive a bill from [facility] for your lab work.  If you wish to have your labs drawn at another location, please call the office 24 hours in advance to send orders.  If you have any questions regarding directions or hours of operation,  please call [phone].   As a reminder, please drink plenty of water prior to coming for your lab work. Thanks!

## 2021-08-28 NOTE — Telephone Encounter (Signed)
Labs received via fax from Dr. Karmen Stabs office.   Date of labs: 08/06/2021  Labs drawn: CBC, CMP   Glucose 100, RBC 4.25, hemoglobin 13.5, hematocrit 39.3, MCV 92.5  Labs reviewed by Sherron Ales, PA-C.  Document sent to the scan center.

## 2021-10-12 DIAGNOSIS — H43392 Other vitreous opacities, left eye: Secondary | ICD-10-CM | POA: Diagnosis not present

## 2021-10-12 DIAGNOSIS — H43813 Vitreous degeneration, bilateral: Secondary | ICD-10-CM | POA: Diagnosis not present

## 2021-10-12 DIAGNOSIS — H2511 Age-related nuclear cataract, right eye: Secondary | ICD-10-CM | POA: Diagnosis not present

## 2021-10-12 DIAGNOSIS — H35373 Puckering of macula, bilateral: Secondary | ICD-10-CM | POA: Diagnosis not present

## 2021-10-15 DIAGNOSIS — K640 First degree hemorrhoids: Secondary | ICD-10-CM | POA: Diagnosis not present

## 2021-10-15 DIAGNOSIS — D120 Benign neoplasm of cecum: Secondary | ICD-10-CM | POA: Diagnosis not present

## 2021-10-15 DIAGNOSIS — Z86010 Personal history of colonic polyps: Secondary | ICD-10-CM | POA: Diagnosis not present

## 2021-10-15 DIAGNOSIS — Z1211 Encounter for screening for malignant neoplasm of colon: Secondary | ICD-10-CM | POA: Diagnosis not present

## 2021-10-15 DIAGNOSIS — D123 Benign neoplasm of transverse colon: Secondary | ICD-10-CM | POA: Diagnosis not present

## 2021-10-15 DIAGNOSIS — K635 Polyp of colon: Secondary | ICD-10-CM | POA: Diagnosis not present

## 2021-10-22 ENCOUNTER — Other Ambulatory Visit: Payer: Self-pay | Admitting: Physician Assistant

## 2021-10-22 DIAGNOSIS — D869 Sarcoidosis, unspecified: Secondary | ICD-10-CM

## 2021-10-22 DIAGNOSIS — H209 Unspecified iridocyclitis: Secondary | ICD-10-CM

## 2021-10-22 DIAGNOSIS — Z79899 Other long term (current) drug therapy: Secondary | ICD-10-CM

## 2021-10-22 NOTE — Telephone Encounter (Signed)
Next Visit: 03/01/2022  Last Visit: 08/28/2021  Last Fill: 07/03/2021  DX:  Sarcoidosis  Current Dose per office note 08/28/2021: Methotrexate 2.5 mg 2 tablets by mouth once weekly   Labs: 08/06/2021 Glucose 100, RBC 4.25, hemoglobin 13.5, hematocrit 39.3, MCV 92.5  Okay to refill MTX?

## 2021-11-05 DIAGNOSIS — H35373 Puckering of macula, bilateral: Secondary | ICD-10-CM | POA: Diagnosis not present

## 2021-11-05 DIAGNOSIS — H11153 Pinguecula, bilateral: Secondary | ICD-10-CM | POA: Diagnosis not present

## 2021-11-05 DIAGNOSIS — H25041 Posterior subcapsular polar age-related cataract, right eye: Secondary | ICD-10-CM | POA: Diagnosis not present

## 2021-12-11 DIAGNOSIS — L814 Other melanin hyperpigmentation: Secondary | ICD-10-CM | POA: Diagnosis not present

## 2021-12-11 DIAGNOSIS — D485 Neoplasm of uncertain behavior of skin: Secondary | ICD-10-CM | POA: Diagnosis not present

## 2021-12-11 DIAGNOSIS — L821 Other seborrheic keratosis: Secondary | ICD-10-CM | POA: Diagnosis not present

## 2021-12-11 DIAGNOSIS — D045 Carcinoma in situ of skin of trunk: Secondary | ICD-10-CM | POA: Diagnosis not present

## 2021-12-11 DIAGNOSIS — L578 Other skin changes due to chronic exposure to nonionizing radiation: Secondary | ICD-10-CM | POA: Diagnosis not present

## 2021-12-11 DIAGNOSIS — D2372 Other benign neoplasm of skin of left lower limb, including hip: Secondary | ICD-10-CM | POA: Diagnosis not present

## 2022-01-04 DIAGNOSIS — D045 Carcinoma in situ of skin of trunk: Secondary | ICD-10-CM | POA: Diagnosis not present

## 2022-01-09 DIAGNOSIS — N529 Male erectile dysfunction, unspecified: Secondary | ICD-10-CM | POA: Diagnosis not present

## 2022-02-18 ENCOUNTER — Other Ambulatory Visit: Payer: Self-pay | Admitting: Physician Assistant

## 2022-02-18 DIAGNOSIS — H209 Unspecified iridocyclitis: Secondary | ICD-10-CM

## 2022-02-18 DIAGNOSIS — D869 Sarcoidosis, unspecified: Secondary | ICD-10-CM

## 2022-02-18 DIAGNOSIS — Z79899 Other long term (current) drug therapy: Secondary | ICD-10-CM

## 2022-02-19 ENCOUNTER — Telehealth: Payer: Self-pay | Admitting: *Deleted

## 2022-02-19 NOTE — Telephone Encounter (Signed)
Patient contacted the office stating his prescription for MTX was denied. Patient states he is flying out of the [place] the day of his follow up appointment in the office 03/01/2022.   Attempted to contact the patient and left message to advise patient his labs are very over due. Last set of labs are from 08/06/2021. Advised patient we will need the labs updated prior to being able to refill his medication. Patient advised to call the office to advise when he can update labs.

## 2022-02-22 NOTE — Progress Notes (Unsigned)
Office Visit Note  Patient: Alexander Nelson             Date of Birth: 08-09-1966           MRN: [id number]             PCP: Brynda Peon, MD Referring: Brynda Peon, MD Visit Date: 03/01/2022 Occupation: @GUAROCC @  Subjective:  No chief complaint on file.   History of Present Illness: Alexander Nelson is a 56 y.o. male ***   Activities of Daily Living:  Patient reports morning stiffness for *** {minute/hour:19697}.   Patient {ACTIONS;DENIES/REPORTS:21021675::"Denies"} nocturnal pain.  Difficulty dressing/grooming: {ACTIONS;DENIES/REPORTS:21021675::"Denies"} Difficulty climbing stairs: {ACTIONS;DENIES/REPORTS:21021675::"Denies"} Difficulty getting out of chair: {ACTIONS;DENIES/REPORTS:21021675::"Denies"} Difficulty using hands for taps, buttons, cutlery, and/or writing: {ACTIONS;DENIES/REPORTS:21021675::"Denies"}  No Rheumatology ROS completed.   PMFS History:  Patient Active Problem List   Diagnosis Date Noted   Vitamin D deficiency 12/17/2016   High risk medication use 12/16/2016   Uveitis 12/16/2016   Sarcoidosis 10/18/2014    Past Medical History:  Diagnosis Date   Abnormal chest x-ray 3 weeks ago   Bulging lumbar disc    L4, L5   Sarcoidosis     Family History  Problem Relation Age of Onset   Diabetes Mother    Heart attack Father    Kidney disease Brother    Healthy Daughter    Healthy Daughter    Healthy Daughter    Past Surgical History:  Procedure Laterality Date   CATARACT EXTRACTION Left    ENDOBRONCHIAL ULTRASOUND Bilateral 11/14/2014   Procedure: ENDOBRONCHIAL ULTRASOUND;  Surgeon: 01/14/2015, MD;  Location: [facility];  Service: Endoscopy;  Laterality: Bilateral;   INGUINAL HERNIA REPAIR Bilateral 04/04/2016   Procedure: LAPAROSCOPIC BILATERAL INGUINAL HERNIA REPAIR;  Surgeon: 04/06/2016, MD;  Location: [facility];  Service: General;  Laterality: Bilateral;   INSERTION OF MESH Bilateral 04/04/2016   Procedure: INSERTION OF MESH;   Surgeon: 04/06/2016, MD;  Location: [facility];  Service: General;  Laterality: Bilateral;   REFRACTIVE SURGERY  2002   TONSILLECTOMY  age 23   Social History   Social History Narrative   Not on file   Immunization History  Administered Date(s) Administered   Influenza Split 06/24/2015   Influenza,inj,Quad PF,6+ Mos 06/03/2016   Influenza-Unspecified 07/10/2014   PFIZER(Purple Top)SARS-COV-2 Vaccination 12/04/2019, 12/12/2019, 08/05/2020     Objective: Vital Signs: There were no vitals taken for this visit.   Physical Exam   Musculoskeletal Exam: ***  CDAI Exam: CDAI Score: -- Patient Global: --; Provider Global: -- Swollen: --; Tender: -- Joint Exam 03/01/2022   No joint exam has been documented for this visit   There is currently no information documented on the homunculus. Go to the Rheumatology activity and complete the homunculus joint exam.  Investigation: No additional findings.  Imaging: No results found.  Recent Labs: Lab Results  Component Value Date   WBC 5.2 03/27/2021   HGB 13.5 03/27/2021   PLT 195 03/27/2021   NA 138 03/27/2021   K 4.4 03/27/2021   CL 101 03/27/2021   CO2 32 03/27/2021   GLUCOSE 102 (H) 03/27/2021   BUN 18 03/27/2021   CREATININE 1.18 03/27/2021   BILITOT 0.5 03/27/2021   ALKPHOS 65 12/23/2016   AST 21 03/27/2021   ALT 22 03/27/2021   PROT 7.2 03/27/2021   ALBUMIN 4.3 12/23/2016   CALCIUM 9.8 03/27/2021   GFRAA 90 10/30/2020    Speciality Comments: No specialty comments available.  Procedures:  No  procedures performed Allergies: Bee venom   Assessment / Plan:     Visit Diagnoses: Sarcoidosis  High risk medication use  Uveitis  Pain in right hand  Vitamin D deficiency  History of cataract extraction  Orders: No orders of the defined types were placed in this encounter.  No orders of the defined types were placed in this encounter.   Face-to-face time spent with patient was *** minutes. Greater than  50% of time was spent in counseling and coordination of care.  Follow-Up Instructions: No follow-ups on file.   Gearldine Bienenstock, PA-C  Note - This record has been created using Dragon software.  Chart creation errors have been sought, but may not always  have been located. Such creation errors do not reflect on  the standard of medical care.

## 2022-03-01 ENCOUNTER — Encounter: Payer: Self-pay | Admitting: Rheumatology

## 2022-03-01 ENCOUNTER — Ambulatory Visit (INDEPENDENT_AMBULATORY_CARE_PROVIDER_SITE_OTHER): Payer: BC Managed Care – PPO | Admitting: Rheumatology

## 2022-03-01 VITALS — BP 119/72 | HR 52 | Resp 14 | Ht 73.0 in | Wt 178.0 lb

## 2022-03-01 DIAGNOSIS — M79641 Pain in right hand: Secondary | ICD-10-CM

## 2022-03-01 DIAGNOSIS — Z9849 Cataract extraction status, unspecified eye: Secondary | ICD-10-CM

## 2022-03-01 DIAGNOSIS — Z79899 Other long term (current) drug therapy: Secondary | ICD-10-CM

## 2022-03-01 DIAGNOSIS — D869 Sarcoidosis, unspecified: Secondary | ICD-10-CM | POA: Diagnosis not present

## 2022-03-01 DIAGNOSIS — E559 Vitamin D deficiency, unspecified: Secondary | ICD-10-CM

## 2022-03-01 DIAGNOSIS — H209 Unspecified iridocyclitis: Secondary | ICD-10-CM | POA: Diagnosis not present

## 2022-03-02 LAB — COMPLETE METABOLIC PANEL WITH GFR
AG Ratio: 1.8 (calc) (ref 1.0–2.5)
ALT: 32 U/L (ref 9–46)
AST: 36 U/L — ABNORMAL HIGH (ref 10–35)
Albumin: 4.6 g/dL (ref 3.6–5.1)
Alkaline phosphatase (APISO): 54 U/L (ref 35–144)
BUN/Creatinine Ratio: 27 (calc) — ABNORMAL HIGH (ref 6–22)
BUN: 31 mg/dL — ABNORMAL HIGH (ref 7–25)
CO2: 30 mmol/L (ref 20–32)
Calcium: 9.9 mg/dL (ref 8.6–10.3)
Chloride: 104 mmol/L (ref 98–110)
Creat: 1.15 mg/dL (ref 0.70–1.30)
Globulin: 2.5 g/dL (calc) (ref 1.9–3.7)
Glucose, Bld: 77 mg/dL (ref 65–99)
Potassium: 4.3 mmol/L (ref 3.5–5.3)
Sodium: 141 mmol/L (ref 135–146)
Total Bilirubin: 0.6 mg/dL (ref 0.2–1.2)
Total Protein: 7.1 g/dL (ref 6.1–8.1)
eGFR: 76 mL/min/{1.73_m2} (ref >=60)

## 2022-03-02 LAB — CBC WITH DIFFERENTIAL/PLATELET
Absolute Monocytes: 572 cells/uL (ref 200–950)
Basophils Absolute: 38 cells/uL (ref 0–200)
Basophils Relative: 0.7 %
Eosinophils Absolute: 173 cells/uL (ref 15–500)
Eosinophils Relative: 3.2 %
HCT: 42.1 % (ref 38.5–50.0)
Hemoglobin: 14.2 g/dL (ref 13.2–17.1)
Lymphs Abs: 2144 cells/uL (ref 850–3900)
MCH: 32.3 pg (ref 27.0–33.0)
MCHC: 33.7 g/dL (ref 32.0–36.0)
MCV: 95.7 fL (ref 80.0–100.0)
MPV: 9.5 fL (ref 7.5–12.5)
Monocytes Relative: 10.6 %
Neutro Abs: 2473 cells/uL (ref 1500–7800)
Neutrophils Relative %: 45.8 %
Platelets: 200 10*3/uL (ref 140–400)
RBC: 4.4 10*6/uL (ref 4.20–5.80)
RDW: 12.5 % (ref 11.0–15.0)
Total Lymphocyte: 39.7 %
WBC: 5.4 10*3/uL (ref 3.8–10.8)

## 2022-03-14 DIAGNOSIS — Z0000 Encounter for general adult medical examination without abnormal findings: Secondary | ICD-10-CM | POA: Diagnosis not present

## 2022-03-14 DIAGNOSIS — D849 Immunodeficiency, unspecified: Secondary | ICD-10-CM | POA: Diagnosis not present

## 2022-03-14 DIAGNOSIS — D869 Sarcoidosis, unspecified: Secondary | ICD-10-CM | POA: Diagnosis not present

## 2022-03-25 ENCOUNTER — Other Ambulatory Visit: Payer: Self-pay | Admitting: Physician Assistant

## 2022-03-25 DIAGNOSIS — D869 Sarcoidosis, unspecified: Secondary | ICD-10-CM

## 2022-03-25 DIAGNOSIS — H209 Unspecified iridocyclitis: Secondary | ICD-10-CM

## 2022-03-25 DIAGNOSIS — Z79899 Other long term (current) drug therapy: Secondary | ICD-10-CM

## 2022-03-25 NOTE — Telephone Encounter (Signed)
Next Visit: 08/05/2022  Last Visit: 03/01/2022  Last Fill: 10/22/2021  DX: Sarcoidosis   Current Dose per office note 03/01/2022: Methotrexate 2.5 mg 2 tablets by mouth once weekly   Labs: 03/01/2022 CBC is normal.  LFTs mildly elevated  Okay to refill MTX?

## 2022-04-01 DIAGNOSIS — Z0000 Encounter for general adult medical examination without abnormal findings: Secondary | ICD-10-CM | POA: Diagnosis not present

## 2022-04-01 DIAGNOSIS — Z91030 Bee allergy status: Secondary | ICD-10-CM | POA: Diagnosis not present

## 2022-04-01 DIAGNOSIS — D126 Benign neoplasm of colon, unspecified: Secondary | ICD-10-CM | POA: Diagnosis not present

## 2022-04-01 DIAGNOSIS — Z79899 Other long term (current) drug therapy: Secondary | ICD-10-CM | POA: Diagnosis not present

## 2022-04-19 DIAGNOSIS — H43392 Other vitreous opacities, left eye: Secondary | ICD-10-CM | POA: Diagnosis not present

## 2022-04-19 DIAGNOSIS — H35371 Puckering of macula, right eye: Secondary | ICD-10-CM | POA: Diagnosis not present

## 2022-04-19 DIAGNOSIS — H2511 Age-related nuclear cataract, right eye: Secondary | ICD-10-CM | POA: Diagnosis not present

## 2022-04-19 DIAGNOSIS — H43813 Vitreous degeneration, bilateral: Secondary | ICD-10-CM | POA: Diagnosis not present

## 2022-07-08 DIAGNOSIS — Z08 Encounter for follow-up examination after completed treatment for malignant neoplasm: Secondary | ICD-10-CM | POA: Diagnosis not present

## 2022-07-08 DIAGNOSIS — L578 Other skin changes due to chronic exposure to nonionizing radiation: Secondary | ICD-10-CM | POA: Diagnosis not present

## 2022-07-08 DIAGNOSIS — L814 Other melanin hyperpigmentation: Secondary | ICD-10-CM | POA: Diagnosis not present

## 2022-07-08 DIAGNOSIS — D2372 Other benign neoplasm of skin of left lower limb, including hip: Secondary | ICD-10-CM | POA: Diagnosis not present

## 2022-07-09 ENCOUNTER — Other Ambulatory Visit: Payer: Self-pay | Admitting: Physician Assistant

## 2022-07-09 DIAGNOSIS — Z79899 Other long term (current) drug therapy: Secondary | ICD-10-CM

## 2022-07-09 DIAGNOSIS — D869 Sarcoidosis, unspecified: Secondary | ICD-10-CM

## 2022-07-09 DIAGNOSIS — H209 Unspecified iridocyclitis: Secondary | ICD-10-CM

## 2022-07-09 NOTE — Telephone Encounter (Signed)
Next Visit: 08/05/2022   Last Visit: 03/01/2022   Last Fill: 03/25/2022  DX: Sarcoidosis    Current Dose per office note 03/01/2022: Methotrexate 2.5 mg 2 tablets by mouth once weekly    Labs: 03/01/2022 CBC is normal.  LFTs mildly elevated  Patient advised he is due to update labs. Patient requested we fax orders to PCP;. Lab orders faxed.    Okay to refill MTX?

## 2022-07-17 DIAGNOSIS — D869 Sarcoidosis, unspecified: Secondary | ICD-10-CM | POA: Diagnosis not present

## 2022-07-22 NOTE — Progress Notes (Signed)
Office Visit Note  Patient: Alexander Nelson             Date of Birth: Sep 08, 1966           MRN: [id number]             PCP: Brynda Peon, MD Referring: Brynda Peon, MD Visit Date: 08/05/2022 Occupation: @GUAROCC @  Subjective:  Medication monitoring   History of Present Illness: Prateek Knipple is a 56 y.o. male with history of sarcoidosis and uveitis.  He is taking Methotrexate 2.5 mg 2 tablets by mouth once weekly and folic acid 1 mg daily.  He is tolerating methotrexate without any side effects and has not missed any doses recently.  He requested a refill of methotrexate to be sent to the pharmacy due to being due for his methotrexate dose today.  He denies any signs or symptoms of a uveitis flare.  He states he has been seeing Dr. 53 on a yearly basis now since he has not had any signs or symptoms of a flare.  He is not experiencing eye pain, photophobia, or eye redness.  He states he is having some increased discomfort in his right fourth PIP joint but states that he felt a pop while exercising a couple months ago and has had residual discomfort since then.  He denies any joint swelling.  He denies any other joint pain or inflammation.  He denies any new or worsening pulmonary symptoms. He denies any recent or recurrent infections.  He denies any new medical conditions.    Activities of Daily Living:  Patient reports morning stiffness for few minutes  Patient Denies nocturnal pain.  Difficulty dressing/grooming: Denies Difficulty climbing stairs: Denies Difficulty getting out of chair: Denies Difficulty using hands for taps, buttons, cutlery, and/or writing: Denies  Review of Systems  Constitutional:  Negative for fatigue and night sweats.  HENT:  Negative for mouth sores, mouth dryness and nose dryness.   Eyes:  Negative for redness and dryness.  Respiratory:  Negative for shortness of breath and difficulty breathing.   Cardiovascular:  Negative for chest pain,  palpitations, hypertension, irregular heartbeat and swelling in legs/feet.  Gastrointestinal:  Negative for blood in stool, constipation and diarrhea.  Endocrine: Negative.  Negative for increased urination.  Genitourinary:  Negative for painful urination.  Musculoskeletal:  Positive for joint pain, joint pain and morning stiffness. Negative for joint swelling, myalgias, muscle weakness, muscle tenderness and myalgias.  Skin:  Negative for color change, rash, hair loss, nodules/bumps, skin tightness, ulcers and sensitivity to sunlight.  Allergic/Immunologic: Negative for susceptible to infections.  Neurological:  Negative for dizziness, fainting, memory loss, night sweats and weakness.  Hematological:  Negative for swollen glands.  Psychiatric/Behavioral:  Negative for depressed mood and sleep disturbance. The patient is not nervous/anxious.     PMFS History:  Patient Active Problem List   Diagnosis Date Noted   Vitamin D deficiency 12/17/2016   High risk medication use 12/16/2016   Uveitis 12/16/2016   Sarcoidosis 10/18/2014    Past Medical History:  Diagnosis Date   Abnormal chest x-ray 3 weeks ago   Bulging lumbar disc    L4, L5   Sarcoidosis     Family History  Problem Relation Age of Onset   Diabetes Mother    Heart attack Father    Kidney disease Brother    Healthy Daughter    Healthy Daughter    Healthy Daughter    Past Surgical History:  Procedure Laterality Date  CATARACT EXTRACTION Left    ENDOBRONCHIAL ULTRASOUND Bilateral 11/14/2014   Procedure: ENDOBRONCHIAL ULTRASOUND;  Surgeon: Barbaraann Share, MD;  Location: [facility];  Service: Endoscopy;  Laterality: Bilateral;   INGUINAL HERNIA REPAIR Bilateral 04/04/2016   Procedure: LAPAROSCOPIC BILATERAL INGUINAL HERNIA REPAIR;  Surgeon: Avel Peace, MD;  Location: [facility];  Service: General;  Laterality: Bilateral;   INSERTION OF MESH Bilateral 04/04/2016   Procedure: INSERTION OF MESH;  Surgeon: Avel Peace,  MD;  Location: [facility];  Service: General;  Laterality: Bilateral;   REFRACTIVE SURGERY  2002   TONSILLECTOMY  age 17   Social History   Social History Narrative   Not on file   Immunization History  Administered Date(s) Administered   Influenza Split 06/24/2015   Influenza,inj,Quad PF,6+ Mos 06/03/2016   Influenza-Unspecified 07/10/2014   PFIZER(Purple Top)SARS-COV-2 Vaccination 12/04/2019, 12/12/2019, 08/05/2020     Objective: Vital Signs: BP 108/69 (BP Location: Left Arm, Patient Position: Sitting, Cuff Size: Normal)   Pulse (!) 52   Ht 6\' 1"  (1.854 m)   Wt 189 lb (85.7 kg)   BMI 24.94 kg/m    Physical Exam Vitals and nursing note reviewed.  Constitutional:      Appearance: He is well-developed.  HENT:     Head: Normocephalic and atraumatic.  Eyes:     Conjunctiva/sclera: Conjunctivae normal.     Pupils: Pupils are equal, round, and reactive to light.  Cardiovascular:     Rate and Rhythm: Normal rate and regular rhythm.     Heart sounds: Normal heart sounds.  Pulmonary:     Effort: Pulmonary effort is normal.     Breath sounds: Normal breath sounds.  Abdominal:     General: Bowel sounds are normal.     Palpations: Abdomen is soft.  Musculoskeletal:     Cervical back: Normal range of motion and neck supple.  Skin:    General: Skin is warm and dry.     Capillary Refill: Capillary refill takes less than 2 seconds.  Neurological:     Mental Status: He is alert and oriented to person, place, and time.  Psychiatric:        Behavior: Behavior normal.      Musculoskeletal Exam: C-spine, thoracic spine, lumbar spine good range of motion.  No midline spinal tenderness or SI joint tenderness.  Shoulder joints, elbow joints, wrist joints, MCPs, PIPs, DIPs have good range of motion with no synovitis.  Complete fist formation bilaterally.  Some PIP and DIP thickening consistent with early osteoarthritic changes noted.  Hip joints have good range of motion with no groin  pain.  Knee joints have good range of motion with no warmth or effusion.  Ankle joints have good range of motion with no tenderness or synovitis.  CDAI Exam: CDAI Score: -- Patient Global: --; Provider Global: -- Swollen: --; Tender: -- Joint Exam 08/05/2022   No joint exam has been documented for this visit   There is currently no information documented on the homunculus. Go to the Rheumatology activity and complete the homunculus joint exam.  Investigation: No additional findings.  Imaging: No results found.  Recent Labs: Lab Results  Component Value Date   WBC 5.4 03/01/2022   HGB 14.2 03/01/2022   PLT 200 03/01/2022   NA 141 03/01/2022   K 4.3 03/01/2022   CL 104 03/01/2022   CO2 30 03/01/2022   GLUCOSE 77 03/01/2022   BUN 31 (H) 03/01/2022   CREATININE 1.15 03/01/2022   BILITOT 0.6 03/01/2022  ALKPHOS 65 12/23/2016   AST 36 (H) 03/01/2022   ALT 32 03/01/2022   PROT 7.1 03/01/2022   ALBUMIN 4.3 12/23/2016   CALCIUM 9.9 03/01/2022   GFRAA 90 10/30/2020    Speciality Comments: No specialty comments available.  Procedures:  No procedures performed Allergies: Bee venom   Assessment / Plan:     Visit Diagnoses: Sarcoidosis - No recurrence of symptoms in many years.  Currently asymptomatic.  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  He remains on low-dose methotrexate 2 tablets by mouth once weekly and folic acid 1 mg daily.  He has been tolerating methotrexate without any side effects and has not missed any doses recently.  He has not had any recent or recurrent infections.  No signs or symptoms of a uveitis flare.  He is following up with Dr. Allyne Gee on a yearly basis due to no recent flares.  No signs of inflammatory arthritis were noted on examination.  No new skin lesions noted.  No new or worsening pulmonary symptoms.  He will remain on low-dose methotrexate and folic acid as prescribed.  A refill of  methotrexate was sent to the pharmacy today.  He was advised to notify us if he develops any new or worsening symptoms.  He will follow up in 5 months or sooner if needed.- Plan: methotrexate (RHEUMATREX) 2.5 MG tablet  High risk medication use - Methotrexate 2.5 mg 2 tablets by mouth once weekly and folic acid 1 mg daily. - Plan: methotrexate (RHEUMATREX) 2.5 MG tablet CBC and CMP were updated on 07/17/2022.  Results were reviewed with the patient today in the office.  His next lab work will be due in February and every 3 months.  He plans on having his next lab work ordered by his PCP. Discussed the importance of holding methotrexate if he develops signs or symptoms of an infection and to resume once the infection has completely cleared. He denies any new medical conditions.  Uveitis - He is followed by Dr. Allyne Gee.  No signs or symptoms of a flare.  He is not experiencing any photophobia, eye redness, or eye pain at this time.  He will remain on low-dose methotrexate as prescribed.  He was advised to notify us if he develops signs or symptoms of a flare.- Plan: methotrexate (RHEUMATREX) 2.5 MG tablet  Pain in right hand: He has intermittent pain in the right fourth finger which she attributes to an injury while working out.  On examination he has no tenderness or synovitis over the right fourth MCP or PIP joint.  Complete fist formation noted.  He was advised to notify us if he develops increased joint pain or joint swelling.  Other medical conditions are listed as follows:  Vitamin D deficiency: He is taking vitamin D 2000 units daily.  History of cataract extraction   Orders: No orders of the defined types were placed in this encounter.  Meds ordered this encounter  Medications   methotrexate (RHEUMATREX) 2.5 MG tablet    Sig: TAKE 2 TABLETS BY MOUTH 1 TIME A WEEK    Dispense:  24 tablet    Refill:  0    Follow-Up Instructions: Return in about 5 months (around 01/04/2023) for  Sarcoidosis, Uveitis.   Gearldine Bienenstock, PA-C  Note - This record has been created using Dragon software.  Chart creation errors have been sought, but may not always  have been located. Such creation errors do not reflect on  the standard of medical care.

## 2022-07-23 ENCOUNTER — Telehealth: Payer: Self-pay | Admitting: *Deleted

## 2022-07-23 NOTE — Telephone Encounter (Signed)
Labs received from:[facility]  Drawn on: 07/17/2022  Reviewed by: Sherron Ales, PA-C  Labs drawn: UA, CBC,CMP  Results: RBC 4.15   Hgb 13.2   Hct 38.3   MCV 92.3   Monocytes 14.3   Basos 1.3   BUN 25   GFR 70.6   LNFM BLD 25  Patient is on MTC 2 tabs po once weekly and Folic Acid 1 mg po daily.

## 2022-08-05 ENCOUNTER — Ambulatory Visit: Payer: BC Managed Care – PPO | Attending: Physician Assistant | Admitting: Physician Assistant

## 2022-08-05 ENCOUNTER — Encounter: Payer: Self-pay | Admitting: Physician Assistant

## 2022-08-05 VITALS — BP 108/69 | HR 52 | Ht 73.0 in | Wt 189.0 lb

## 2022-08-05 DIAGNOSIS — M79641 Pain in right hand: Secondary | ICD-10-CM

## 2022-08-05 DIAGNOSIS — Z79899 Other long term (current) drug therapy: Secondary | ICD-10-CM

## 2022-08-05 DIAGNOSIS — D869 Sarcoidosis, unspecified: Secondary | ICD-10-CM

## 2022-08-05 DIAGNOSIS — H209 Unspecified iridocyclitis: Secondary | ICD-10-CM

## 2022-08-05 DIAGNOSIS — Z9849 Cataract extraction status, unspecified eye: Secondary | ICD-10-CM

## 2022-08-05 DIAGNOSIS — E559 Vitamin D deficiency, unspecified: Secondary | ICD-10-CM

## 2022-08-05 MED ORDER — METHOTREXATE SODIUM 2.5 MG PO TABS: ORAL_TABLET | ORAL | 0 refills | Status: DC

## 2022-08-05 NOTE — Patient Instructions (Signed)
Standing Labs We placed an order today for your standing lab work.   Please have your standing labs drawn in February and every 3 months   Please have your labs drawn 2 weeks prior to your appointment so that the provider can discuss your lab results at your appointment.  Please note that you may see your imaging and lab results in MyChart before we have reviewed them. We will contact you once all results are reviewed. Please allow our office up to 72 hours to thoroughly review all of the results before contacting the office for clarification of your results.  Lab hours are:   Monday through Thursday from 8:00 am -12:30 pm and 1:00 pm-5:00 pm and Friday from 8:00 am-12:00 pm.  Please be advised, all patients with office appointments requiring lab work will take precedent over walk-in lab work.   Labs are drawn by [facility]. Please bring your co-pay at the time of your lab draw.  You may receive a bill from [facility] for your lab work.  Please note if you are on Hydroxychloroquine and and an order has been placed for a Hydroxychloroquine level, you will need to have it drawn 4 hours or more after your last dose.  If you wish to have your labs drawn at another location, please call the office 24 hours in advance so we can fax the orders.  The office is located at [address], [address], [place], [place] [address] No appointment is necessary.    If you have any questions regarding directions or hours of operation,  please call [phone].   As a reminder, please drink plenty of water prior to coming for your lab work. Thanks!  

## 2022-11-16 DIAGNOSIS — S2231XA Fracture of one rib, right side, initial encounter for closed fracture: Secondary | ICD-10-CM | POA: Diagnosis not present

## 2022-11-21 ENCOUNTER — Other Ambulatory Visit: Payer: Self-pay | Admitting: *Deleted

## 2022-11-21 ENCOUNTER — Other Ambulatory Visit: Payer: Self-pay | Admitting: Physician Assistant

## 2022-11-21 DIAGNOSIS — H209 Unspecified iridocyclitis: Secondary | ICD-10-CM

## 2022-11-21 DIAGNOSIS — Z79899 Other long term (current) drug therapy: Secondary | ICD-10-CM

## 2022-11-21 DIAGNOSIS — D869 Sarcoidosis, unspecified: Secondary | ICD-10-CM

## 2022-11-21 NOTE — Telephone Encounter (Signed)
Next Visit: 01/06/2023  Last Visit: 08/05/2022  Last Fill: 08/05/2022  DX: Sarcoidosis   Current Dose per office note 08/05/2022: Methotrexate 2.5 mg 2 tablets by mouth once weekly   Labs: 07/17/2022 RBC 4.15  Hgb 13.2  Hct 38.3  MCV 92.3  Monocytes 14.3  Basos 1.3 BUN 25  GFR 70.6  LNFM BLD 25  Patient advised he is due to update labs. Patient will make appt with PCP to update labs.   Okay to refill MTX?

## 2022-12-23 NOTE — Progress Notes (Unsigned)
Office Visit Note  Patient: Alexander Nelson             Date of Birth: 1965-12-22           MRN: [id number]             PCP: Brynda Peon, MD Referring: Brynda Peon, MD Visit Date: 01/06/2023 Occupation: @GUAROCC @  Subjective:  Medication monitoring   History of Present Illness: Alexander Nelson is a 57 y.o. male with history of sarcoidosis and uveitis.  He remains on Methotrexate 2.5 mg 2 tablets by mouth once weekly and folic acid 1 mg daily.  He is tolerating low-dose methotrexate without any side effects and denies missing any doses recently.  He does require refill of methotrexate to be sent to the pharmacy today and will be having updated lab work at his appointment today.  He denies any signs or symptoms of a uveitis flare.  He continues to see the ophthalmologist on a yearly basis.  He denies any new or worsening pulmonary symptoms.  He denies any joint pain or joint swelling at this time.  He denies any new medical conditions.  He denies any recurrent infections.   Activities of Daily Living:  Patient reports morning stiffness for 0 minutes.   Patient Denies nocturnal pain.  Difficulty dressing/grooming: Denies Difficulty climbing stairs: Denies Difficulty getting out of chair: Denies Difficulty using hands for taps, buttons, cutlery, and/or writing: Denies  Review of Systems  Constitutional:  Negative for fatigue.  HENT:  Negative for mouth sores and mouth dryness.   Eyes:  Negative for dryness.  Respiratory:  Negative for shortness of breath.   Cardiovascular:  Negative for chest pain and palpitations.  Gastrointestinal:  Negative for blood in stool, constipation and diarrhea.  Endocrine: Negative for increased urination.  Genitourinary:  Negative for involuntary urination.  Musculoskeletal:  Negative for joint pain, gait problem, joint pain, joint swelling, myalgias, muscle weakness, morning stiffness, muscle tenderness and myalgias.  Skin:  Negative for color  change, rash, hair loss and sensitivity to sunlight.  Allergic/Immunologic: Negative for susceptible to infections.  Neurological:  Negative for dizziness and headaches.  Hematological:  Negative for swollen glands.  Psychiatric/Behavioral:  Negative for depressed mood and sleep disturbance. The patient is not nervous/anxious.     PMFS History:  Patient Active Problem List   Diagnosis Date Noted   Vitamin D deficiency 12/17/2016   High risk medication use 12/16/2016   Uveitis 12/16/2016   Sarcoidosis 10/18/2014    Past Medical History:  Diagnosis Date   Abnormal chest x-ray 3 weeks ago   Bulging lumbar disc    L4, L5   Sarcoidosis     Family History  Problem Relation Age of Onset   Diabetes Mother    Heart attack Father    Kidney disease Brother    Healthy Daughter    Healthy Daughter    Healthy Daughter    Past Surgical History:  Procedure Laterality Date   CATARACT EXTRACTION Left    ENDOBRONCHIAL ULTRASOUND Bilateral 11/14/2014   Procedure: ENDOBRONCHIAL ULTRASOUND;  Surgeon: Barbaraann Share, MD;  Location: [facility];  Service: Endoscopy;  Laterality: Bilateral;   INGUINAL HERNIA REPAIR Bilateral 04/04/2016   Procedure: LAPAROSCOPIC BILATERAL INGUINAL HERNIA REPAIR;  Surgeon: Avel Peace, MD;  Location: [facility];  Service: General;  Laterality: Bilateral;   INSERTION OF MESH Bilateral 04/04/2016   Procedure: INSERTION OF MESH;  Surgeon: Avel Peace, MD;  Location: [facility];  Service: General;  Laterality:  Bilateral;   REFRACTIVE SURGERY  2002   TONSILLECTOMY  age 76   Social History   Social History Narrative   Not on file   Immunization History  Administered Date(s) Administered   Influenza Split 06/24/2015   Influenza,inj,Quad PF,6+ Mos 06/03/2016   Influenza-Unspecified 07/10/2014   PFIZER(Purple Top)SARS-COV-2 Vaccination 12/04/2019, 12/12/2019, 08/05/2020     Objective: Vital Signs: BP 111/68 (BP Location: Left Arm, Patient Position: Sitting, Cuff  Size: Normal)   Pulse (!) 50   Ht 6\' 1"  (1.854 m)   Wt 188 lb 3.2 oz (85.4 kg)   BMI 24.83 kg/m    Physical Exam Vitals and nursing note reviewed.  Constitutional:      Appearance: He is well-developed.  HENT:     Head: Normocephalic and atraumatic.  Eyes:     Conjunctiva/sclera: Conjunctivae normal.     Pupils: Pupils are equal, round, and reactive to light.  Cardiovascular:     Rate and Rhythm: Normal rate and regular rhythm.     Heart sounds: Normal heart sounds.  Pulmonary:     Effort: Pulmonary effort is normal.     Breath sounds: Normal breath sounds.  Abdominal:     General: Bowel sounds are normal.     Palpations: Abdomen is soft.  Musculoskeletal:     Cervical back: Normal range of motion and neck supple.  Skin:    General: Skin is warm and dry.     Capillary Refill: Capillary refill takes less than 2 seconds.  Neurological:     Mental Status: He is alert and oriented to person, place, and time.  Psychiatric:        Behavior: Behavior normal.      Musculoskeletal Exam: C-spine, thoracic spine, lumbar spine have good range of motion.  No midline spinal tenderness.  No SI joint tenderness.  Shoulder joints, elbow joints, wrist joints, MCPs, PIPs, DIPs have good range of motion with no synovitis.  Complete fist formation bilaterally.  Hip joints have good range of motion with no groin pain.  Knee joints have good range of motion with no warmth or effusion.  Ankle joints have good range of motion with no tenderness or joint swelling.  CDAI Exam: CDAI Score: -- Patient Global: --; Provider Global: -- Swollen: --; Tender: -- Joint Exam 01/06/2023   No joint exam has been documented for this visit   There is currently no information documented on the homunculus. Go to the Rheumatology activity and complete the homunculus joint exam.  Investigation: No additional findings.  Imaging: No results found.  Recent Labs: Lab Results  Component Value Date   WBC 5.4  03/01/2022   HGB 14.2 03/01/2022   PLT 200 03/01/2022   NA 141 03/01/2022   K 4.3 03/01/2022   CL 104 03/01/2022   CO2 30 03/01/2022   GLUCOSE 77 03/01/2022   BUN 31 (H) 03/01/2022   CREATININE 1.15 03/01/2022   BILITOT 0.6 03/01/2022   ALKPHOS 65 12/23/2016   AST 36 (H) 03/01/2022   ALT 32 03/01/2022   PROT 7.1 03/01/2022   ALBUMIN 4.3 12/23/2016   CALCIUM 9.9 03/01/2022   GFRAA 90 10/30/2020    Speciality Comments: No specialty comments available.  Procedures:  No procedures performed Allergies: Bee venom   Assessment / Plan:     Visit Diagnoses: Sarcoidosis - No recurrence of symptoms in many years.  Currently asymptomatic.  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  He has no synovitis on examination today.  He has not had any new or worsening pulmonary symptoms.  His lungs were clear to auscultation.  He has not had any signs or symptoms of uveitis.  No conjunctival injection noted today.  He sees the ophthalmologist on a yearly basis.  He remains on low-dose methotrexate 2 tablets by mouth once weekly.  He continues to tolerate methotrexate without any side effects and has not missed any doses recently.  CBC and CMP were updated today.  A refill of methotrexate was sent to the pharmacy.  No medication changes will be made at this time.  He is advised to notify us if he develops signs or symptoms of a flare.  He will follow-up in the office in 5 months or sooner if needed.- Plan: methotrexate (RHEUMATREX) 2.5 MG tablet  High risk medication use - Methotrexate 2.5 mg 2 tablets by mouth once weekly and folic acid 1 mg daily. CBC and CMP updated on 07/17/22.  CBC and CMP released today.  His next lab work will be due July and every 3 months.  No recent or recurrent infections. Discussed the importance of holding methotrexate if he develops signs or symptoms of an infection and  to resume once the infection has completely cleared.   - Plan: CBC with Differential/Platelet, COMPLETE METABOLIC PANEL WITH GFR, methotrexate (RHEUMATREX) 2.5 MG tablet  Uveitis - He is followed by Dr. Allyne Gee.  He has not had any signs or symptoms of a uveitis flare.  No conjunctival injection noted.  He remains on low-dose methotrexate 2 tablets by mouth once weekly.  He was advised to notify us if he develops signs or symptoms of a flare.- Plan: methotrexate (RHEUMATREX) 2.5 MG tablet  Pain in right hand: No tenderness or synovitis noted today.  Complete fist formation bilaterally.  Other medical conditions are listed as follows:  Vitamin D deficiency  History of cataract extraction   Orders: Orders Placed This Encounter  Procedures   CBC with Differential/Platelet   COMPLETE METABOLIC PANEL WITH GFR   Meds ordered this encounter  Medications   methotrexate (RHEUMATREX) 2.5 MG tablet    Sig: Take 2 tablets by mouth once weekly. Caution:Chemotherapy. Protect from light.    Dispense:  24 tablet    Refill:  0      Follow-Up Instructions: Return in about 5 months (around 06/08/2023) for Sarcoidosis, Uveitis.   Gearldine Bienenstock, PA-C  Note - This record has been created using Dragon software.  Chart creation errors have been sought, but may not always  have been located. Such creation errors do not reflect on  the standard of medical care.

## 2023-01-06 ENCOUNTER — Ambulatory Visit: Payer: BC Managed Care – PPO | Attending: Physician Assistant | Admitting: Physician Assistant

## 2023-01-06 ENCOUNTER — Encounter: Payer: Self-pay | Admitting: Physician Assistant

## 2023-01-06 VITALS — BP 111/68 | HR 50 | Ht 73.0 in | Wt 188.2 lb

## 2023-01-06 DIAGNOSIS — H209 Unspecified iridocyclitis: Secondary | ICD-10-CM | POA: Diagnosis not present

## 2023-01-06 DIAGNOSIS — Z79899 Other long term (current) drug therapy: Secondary | ICD-10-CM | POA: Diagnosis not present

## 2023-01-06 DIAGNOSIS — E559 Vitamin D deficiency, unspecified: Secondary | ICD-10-CM

## 2023-01-06 DIAGNOSIS — M79641 Pain in right hand: Secondary | ICD-10-CM | POA: Diagnosis not present

## 2023-01-06 DIAGNOSIS — D869 Sarcoidosis, unspecified: Secondary | ICD-10-CM

## 2023-01-06 DIAGNOSIS — Z9849 Cataract extraction status, unspecified eye: Secondary | ICD-10-CM

## 2023-01-06 MED ORDER — METHOTREXATE SODIUM 2.5 MG PO TABS: ORAL_TABLET | ORAL | 0 refills | Status: DC

## 2023-01-06 NOTE — Patient Instructions (Addendum)
Standing Labs We placed an order today for your standing lab work.   Please have your standing labs drawn in August and every 3 months   Please have your labs drawn 2 weeks prior to your appointment so that the provider can discuss your lab results at your appointment, if possible.  Please note that you may see your imaging and lab results in MyChart before we have reviewed them. We will contact you once all results are reviewed. Please allow our office up to 72 hours to thoroughly review all of the results before contacting the office for clarification of your results.  WALK-IN LAB HOURS  Monday through Thursday from 8:00 am -12:30 pm and 1:00 pm-5:00 pm and Friday from 8:00 am-12:00 pm.  Patients with office visits requiring labs will be seen before walk-in labs.  You may encounter longer than normal wait times. Please allow additional time. Wait times may be shorter on  Monday and Thursday afternoons.  We do not book appointments for walk-in labs. We appreciate your patience and understanding with our staff.   Labs are drawn by [facility]. Please bring your co-pay at the time of your lab draw.  You may receive a bill from [facility] for your lab work.  Please note if you are on Hydroxychloroquine and and an order has been placed for a Hydroxychloroquine level,  you will need to have it drawn 4 hours or more after your last dose.  If you wish to have your labs drawn at another location, please call the office 24 hours in advance so we can fax the orders.  The office is located at [address], [address], [place], [place] [address]   If you have any questions regarding directions or hours of operation,  please call [phone].   As a reminder, please drink plenty of water prior to coming for your lab work. Thanks!  

## 2023-01-06 NOTE — Progress Notes (Signed)
CBC WNL

## 2023-01-07 LAB — COMPLETE METABOLIC PANEL WITH GFR
AG Ratio: 1.8 (calc) (ref 1.0–2.5)
ALT: 16 U/L (ref 9–46)
AST: 17 U/L (ref 10–35)
Albumin: 4.4 g/dL (ref 3.6–5.1)
Alkaline phosphatase (APISO): 72 U/L (ref 35–144)
BUN: 18 mg/dL (ref 7–25)
CO2: 31 mmol/L (ref 20–32)
Calcium: 9.9 mg/dL (ref 8.6–10.3)
Chloride: 101 mmol/L (ref 98–110)
Creat: 1 mg/dL (ref 0.70–1.30)
Globulin: 2.5 g/dL (calc) (ref 1.9–3.7)
Glucose, Bld: 118 mg/dL — ABNORMAL HIGH (ref 65–99)
Potassium: 5.1 mmol/L (ref 3.5–5.3)
Sodium: 137 mmol/L (ref 135–146)
Total Bilirubin: 0.4 mg/dL (ref 0.2–1.2)
Total Protein: 6.9 g/dL (ref 6.1–8.1)
eGFR: 89 mL/min/{1.73_m2} (ref >=60)

## 2023-01-07 LAB — CBC WITH DIFFERENTIAL/PLATELET
Absolute Monocytes: 414 cells/uL (ref 200–950)
Basophils Absolute: 28 cells/uL (ref 0–200)
Basophils Relative: 0.5 %
Eosinophils Absolute: 129 cells/uL (ref 15–500)
Eosinophils Relative: 2.3 %
HCT: 39.2 % (ref 38.5–50.0)
Hemoglobin: 13.6 g/dL (ref 13.2–17.1)
Lymphs Abs: 2274 cells/uL (ref 850–3900)
MCH: 31.5 pg (ref 27.0–33.0)
MCHC: 34.7 g/dL (ref 32.0–36.0)
MCV: 90.7 fL (ref 80.0–100.0)
MPV: 9.1 fL (ref 7.5–12.5)
Monocytes Relative: 7.4 %
Neutro Abs: 2755 cells/uL (ref 1500–7800)
Neutrophils Relative %: 49.2 %
Platelets: 211 10*3/uL (ref 140–400)
RBC: 4.32 10*6/uL (ref 4.20–5.80)
RDW: 12.5 % (ref 11.0–15.0)
Total Lymphocyte: 40.6 %
WBC: 5.6 10*3/uL (ref 3.8–10.8)

## 2023-01-07 NOTE — Progress Notes (Signed)
Glucose is 118. Rest of CMP WNL

## 2023-04-07 DIAGNOSIS — R5383 Other fatigue: Secondary | ICD-10-CM | POA: Diagnosis not present

## 2023-04-07 DIAGNOSIS — E559 Vitamin D deficiency, unspecified: Secondary | ICD-10-CM | POA: Diagnosis not present

## 2023-04-07 DIAGNOSIS — D869 Sarcoidosis, unspecified: Secondary | ICD-10-CM | POA: Diagnosis not present

## 2023-04-07 DIAGNOSIS — Z91030 Bee allergy status: Secondary | ICD-10-CM | POA: Diagnosis not present

## 2023-04-07 DIAGNOSIS — Z0000 Encounter for general adult medical examination without abnormal findings: Secondary | ICD-10-CM | POA: Diagnosis not present

## 2023-04-07 DIAGNOSIS — R001 Bradycardia, unspecified: Secondary | ICD-10-CM | POA: Diagnosis not present

## 2023-04-07 DIAGNOSIS — Z79899 Other long term (current) drug therapy: Secondary | ICD-10-CM | POA: Diagnosis not present

## 2023-04-07 DIAGNOSIS — I2510 Atherosclerotic heart disease of native coronary artery without angina pectoris: Secondary | ICD-10-CM | POA: Diagnosis not present

## 2023-04-25 DIAGNOSIS — H2013 Chronic iridocyclitis, bilateral: Secondary | ICD-10-CM | POA: Diagnosis not present

## 2023-04-25 DIAGNOSIS — H35373 Puckering of macula, bilateral: Secondary | ICD-10-CM | POA: Diagnosis not present

## 2023-04-25 DIAGNOSIS — H43813 Vitreous degeneration, bilateral: Secondary | ICD-10-CM | POA: Diagnosis not present

## 2023-06-13 ENCOUNTER — Encounter: Payer: Self-pay | Admitting: Physician Assistant

## 2023-06-13 ENCOUNTER — Ambulatory Visit: Payer: BC Managed Care – PPO | Attending: Rheumatology | Admitting: Physician Assistant

## 2023-06-13 VITALS — BP 115/68 | HR 46 | Resp 16 | Ht 73.0 in | Wt 187.8 lb

## 2023-06-13 DIAGNOSIS — Z79899 Other long term (current) drug therapy: Secondary | ICD-10-CM

## 2023-06-13 DIAGNOSIS — H209 Unspecified iridocyclitis: Secondary | ICD-10-CM

## 2023-06-13 DIAGNOSIS — M79641 Pain in right hand: Secondary | ICD-10-CM | POA: Diagnosis not present

## 2023-06-13 DIAGNOSIS — D869 Sarcoidosis, unspecified: Secondary | ICD-10-CM

## 2023-06-13 DIAGNOSIS — E559 Vitamin D deficiency, unspecified: Secondary | ICD-10-CM

## 2023-06-13 DIAGNOSIS — Z9849 Cataract extraction status, unspecified eye: Secondary | ICD-10-CM

## 2023-06-13 MED ORDER — METHOTREXATE SODIUM 2.5 MG PO TABS: ORAL_TABLET | ORAL | 0 refills | Status: DC

## 2023-06-13 NOTE — Patient Instructions (Signed)

## 2023-06-14 LAB — COMPLETE METABOLIC PANEL WITH GFR
AG Ratio: 1.9 (calc) (ref 1.0–2.5)
ALT: 21 U/L (ref 9–46)
AST: 19 U/L (ref 10–35)
Albumin: 4.3 g/dL (ref 3.6–5.1)
Alkaline phosphatase (APISO): 46 U/L (ref 35–144)
BUN: 20 mg/dL (ref 7–25)
CO2: 30 mmol/L (ref 20–32)
Calcium: 9.5 mg/dL (ref 8.6–10.3)
Chloride: 101 mmol/L (ref 98–110)
Creat: 1.19 mg/dL (ref 0.70–1.30)
Globulin: 2.3 g/dL (ref 1.9–3.7)
Glucose, Bld: 97 mg/dL (ref 65–99)
Potassium: 4.6 mmol/L (ref 3.5–5.3)
Sodium: 137 mmol/L (ref 135–146)
Total Bilirubin: 0.5 mg/dL (ref 0.2–1.2)
Total Protein: 6.6 g/dL (ref 6.1–8.1)
eGFR: 72 mL/min/{1.73_m2} (ref >=60)

## 2023-06-14 LAB — CBC WITH DIFFERENTIAL/PLATELET
Absolute Monocytes: 470 {cells}/uL (ref 200–950)
Basophils Absolute: 28 {cells}/uL (ref 0–200)
Basophils Relative: 0.5 %
Eosinophils Absolute: 202 {cells}/uL (ref 15–500)
Eosinophils Relative: 3.6 %
HCT: 39.4 % (ref 38.5–50.0)
Hemoglobin: 13 g/dL — ABNORMAL LOW (ref 13.2–17.1)
Lymphs Abs: 2022 {cells}/uL (ref 850–3900)
MCH: 31 pg (ref 27.0–33.0)
MCHC: 33 g/dL (ref 32.0–36.0)
MCV: 93.8 fL (ref 80.0–100.0)
MPV: 9.2 fL (ref 7.5–12.5)
Monocytes Relative: 8.4 %
Neutro Abs: 2878 {cells}/uL (ref 1500–7800)
Neutrophils Relative %: 51.4 %
Platelets: 210 10*3/uL (ref 140–400)
RBC: 4.2 10*6/uL (ref 4.20–5.80)
RDW: 12.1 % (ref 11.0–15.0)
Total Lymphocyte: 36.1 %
WBC: 5.6 10*3/uL (ref 3.8–10.8)

## 2023-06-16 NOTE — Progress Notes (Signed)
Hemoglobin is borderline low-13.0. rest of CBC WNL.  CMP WNL

## 2023-07-07 DIAGNOSIS — D485 Neoplasm of uncertain behavior of skin: Secondary | ICD-10-CM | POA: Diagnosis not present

## 2023-07-07 DIAGNOSIS — L578 Other skin changes due to chronic exposure to nonionizing radiation: Secondary | ICD-10-CM | POA: Diagnosis not present

## 2023-07-07 DIAGNOSIS — Z08 Encounter for follow-up examination after completed treatment for malignant neoplasm: Secondary | ICD-10-CM | POA: Diagnosis not present

## 2023-07-07 DIAGNOSIS — Z85828 Personal history of other malignant neoplasm of skin: Secondary | ICD-10-CM | POA: Diagnosis not present

## 2023-07-07 DIAGNOSIS — L739 Follicular disorder, unspecified: Secondary | ICD-10-CM | POA: Diagnosis not present

## 2023-07-07 DIAGNOSIS — Z86007 Personal history of in-situ neoplasm of skin: Secondary | ICD-10-CM | POA: Diagnosis not present

## 2023-12-01 NOTE — Progress Notes (Signed)
 Office Visit Note  Patient: Alexander Nelson             Date of Birth: 01-13-1966           MRN: [id number]             PCP: Brynda Peon, MD Referring: Brynda Peon, MD Visit Date: 12/15/2023 Occupation: @GUAROCC @  Subjective:  Medication management  History of Present Illness: Alexander Nelson is a 58 y.o. 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  He returns today after his last visit in October.  He denies any flares of uveitis.  He denies any shortness of breath.  There is no history of joint inflammation or joint swelling.  He is followed by ophthalmologist on a regular basis.  He has been active and has been exercising on a regular basis.    Activities of Daily Living:  Patient reports morning stiffness for 0 minutes.   Patient Denies nocturnal pain.  Difficulty dressing/grooming: Denies Difficulty climbing stairs: Denies Difficulty getting out of chair: Denies Difficulty using hands for taps, buttons, cutlery, and/or writing: Denies  Review of Systems  Constitutional:  Negative for fatigue.  HENT:  Negative for mouth sores and mouth dryness.   Eyes:  Negative for dryness.  Respiratory:  Negative for shortness of breath.   Cardiovascular:  Negative for chest pain and palpitations.  Gastrointestinal:  Negative for blood in stool, constipation and diarrhea.  Endocrine: Negative for increased urination.  Genitourinary:  Negative for involuntary urination.  Musculoskeletal:  Negative for joint pain, gait problem, joint pain, joint swelling, myalgias, muscle weakness, morning stiffness, muscle tenderness and myalgias.  Skin:  Negative for color change, rash, hair loss and sensitivity to sunlight.  Allergic/Immunologic: Negative for susceptible to infections.  Neurological:  Negative for dizziness and headaches.  Hematological:  Negative for swollen glands.  Psychiatric/Behavioral:  Positive for sleep disturbance. Negative for depressed mood. The patient is not  nervous/anxious.     PMFS History:  Patient Active Problem List   Diagnosis Date Noted   Vitamin D deficiency 12/17/2016   High risk medication use 12/16/2016   Uveitis 12/16/2016   Sarcoidosis 10/18/2014    Past Medical History:  Diagnosis Date   Abnormal chest x-ray 3 weeks ago   Bulging lumbar disc    L4, L5   Sarcoidosis     Family History  Problem Relation Age of Onset   Diabetes Mother    Heart attack Father    Kidney disease Brother    Healthy Daughter    Healthy Daughter    Healthy Daughter    Past Surgical History:  Procedure Laterality Date   CATARACT EXTRACTION Left    ENDOBRONCHIAL ULTRASOUND Bilateral 11/14/2014   Procedure: ENDOBRONCHIAL ULTRASOUND;  Surgeon: Barbaraann Share, MD;  Location: [facility];  Service: Endoscopy;  Laterality: Bilateral;   INGUINAL HERNIA REPAIR Bilateral 04/04/2016   Procedure: LAPAROSCOPIC BILATERAL INGUINAL HERNIA REPAIR;  Surgeon: Avel Peace, MD;  Location: [facility];  Service: General;  Laterality: Bilateral;   INSERTION OF MESH Bilateral 04/04/2016   Procedure: INSERTION OF MESH;  Surgeon: Avel Peace, MD;  Location: [facility];  Service: General;  Laterality: Bilateral;   REFRACTIVE SURGERY  2002   TONSILLECTOMY  age 64   Social History   Social History Narrative   Not on file   Immunization History  Administered Date(s) Administered   Influenza Split 06/24/2015   Influenza,inj,Quad PF,6+ Mos 06/03/2016   Influenza-Unspecified 07/10/2014   PFIZER(Purple Top)SARS-COV-2 Vaccination 12/04/2019, 12/12/2019, 08/05/2020  Objective: Vital Signs: BP 109/62 (BP Location: Left Arm, Patient Position: Sitting, Cuff Size: Normal)   Pulse (!) 48   Resp 15   Ht 6\' 1"  (1.854 m)   Wt 187 lb 3.2 oz (84.9 kg)   BMI 24.70 kg/m    Physical Exam Vitals and nursing note reviewed.  Constitutional:      Appearance: He is well-developed.  HENT:     Head: Normocephalic and atraumatic.  Eyes:     Conjunctiva/sclera: Conjunctivae  normal.     Pupils: Pupils are equal, round, and reactive to light.  Cardiovascular:     Rate and Rhythm: Normal rate and regular rhythm.     Heart sounds: Normal heart sounds.  Pulmonary:     Effort: Pulmonary effort is normal.     Breath sounds: Normal breath sounds.  Abdominal:     General: Bowel sounds are normal.     Palpations: Abdomen is soft.  Musculoskeletal:     Cervical back: Normal range of motion and neck supple.  Skin:    General: Skin is warm and dry.     Capillary Refill: Capillary refill takes less than 2 seconds.  Neurological:     Mental Status: He is alert and oriented to person, place, and time.  Psychiatric:        Behavior: Behavior normal.      Musculoskeletal Exam: Cervical, thoracic and lumbar spine were in good range of motion.  Shoulders, elbows, wrist joints, MCPs PIPs and DIPs with good range of motion.  Bilateral PIP and DIP thickening was noted.  Hip joints and knee joints in good range of motion without any warmth swelling or effusion.  There was no tenderness over ankles or MTPs.    CDAI Exam: CDAI Score: -- Patient Global: --; Provider Global: -- Swollen: --; Tender: -- Joint Exam 12/15/2023   No joint exam has been documented for this visit   There is currently no information documented on the homunculus. Go to the Rheumatology activity and complete the homunculus joint exam.  Investigation: No additional findings.  Imaging: No results found.  Recent Labs: Lab Results  Component Value Date   WBC 5.6 06/13/2023   HGB 13.0 (L) 06/13/2023   PLT 210 06/13/2023   NA 137 06/13/2023   K 4.6 06/13/2023   CL 101 06/13/2023   CO2 30 06/13/2023   GLUCOSE 97 06/13/2023   BUN 20 06/13/2023   CREATININE 1.19 06/13/2023   BILITOT 0.5 06/13/2023   ALKPHOS 65 12/23/2016   AST 19 06/13/2023   ALT 21 06/13/2023   PROT 6.6 06/13/2023   ALBUMIN 4.3 12/23/2016   CALCIUM 9.5 06/13/2023   GFRAA 90 10/30/2020    Speciality Comments: No  specialty comments available.  Procedures:  No procedures performed Allergies: Bee venom   Assessment / Plan:     Visit Diagnoses: Sarcoidosis - No recurrence of symptoms in many years. 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  He has been seeing ophthalmologist on a regular basis.  He denies any shortness of breath.  Prescription refill for methotrexate and folic acid was sent.  High risk medication use - Methotrexate 2.5 mg 2 tablets by mouth once weekly and folic acid 1 mg daily.  CBC and CMP were normal except hemoglobin was low at 13.0 on June 13, 2023.  Will recheck labs today.  He was advised to get  labs every 3 months.  Uveitis - He is followed by Dr. Allyne Gee.  Patient denies having a flare of uveitis.  Vitamin D deficiency-he has been taking vitamin D.  Will check vitamin D level today.  Other medical problems listed as follows:  History of cataract extraction    Orders: Orders Placed This Encounter  Procedures   Comprehensive metabolic panel with GFR   CBC with Differential/Platelet   CBC with Differential/Platelet   Comprehensive metabolic panel with GFR   VITAMIN D 25 Hydroxy (Vit-D Deficiency, Fractures)   Meds ordered this encounter  Medications   folic acid (FOLVITE) 1 MG tablet    Sig: Take 1 tablet (1 mg total) by mouth daily.    Dispense:  90 tablet    Refill:  3   methotrexate (RHEUMATREX) 2.5 MG tablet    Sig: Take 2 tablets by mouth once weekly. Caution:Chemotherapy. Protect from light.    Dispense:  24 tablet    Refill:  0     Follow-Up Instructions: Return in about 6 months (around 06/15/2024) for Sarcoidosis.   Pollyann Savoy, MD  Note - This record has been created using Animal nutritionist.  Chart creation errors have been sought, but may not always  have been located. Such creation errors do not  reflect on  the standard of medical care.

## 2023-12-12 ENCOUNTER — Ambulatory Visit: Payer: BC Managed Care – PPO | Admitting: Rheumatology

## 2023-12-15 ENCOUNTER — Ambulatory Visit: Payer: BC Managed Care – PPO | Attending: Rheumatology | Admitting: Rheumatology

## 2023-12-15 ENCOUNTER — Encounter: Payer: Self-pay | Admitting: Rheumatology

## 2023-12-15 VITALS — BP 109/62 | HR 48 | Resp 15 | Ht 73.0 in | Wt 187.2 lb

## 2023-12-15 DIAGNOSIS — E559 Vitamin D deficiency, unspecified: Secondary | ICD-10-CM

## 2023-12-15 DIAGNOSIS — H209 Unspecified iridocyclitis: Secondary | ICD-10-CM | POA: Diagnosis not present

## 2023-12-15 DIAGNOSIS — Z79899 Other long term (current) drug therapy: Secondary | ICD-10-CM | POA: Diagnosis not present

## 2023-12-15 DIAGNOSIS — D869 Sarcoidosis, unspecified: Secondary | ICD-10-CM

## 2023-12-15 DIAGNOSIS — Z9849 Cataract extraction status, unspecified eye: Secondary | ICD-10-CM

## 2023-12-15 DIAGNOSIS — M79641 Pain in right hand: Secondary | ICD-10-CM

## 2023-12-15 MED ORDER — METHOTREXATE SODIUM 2.5 MG PO TABS: ORAL_TABLET | ORAL | 0 refills | Status: DC

## 2023-12-15 MED ORDER — FOLIC ACID 1 MG PO TABS
1.0000 mg | ORAL_TABLET | Freq: Every day | ORAL | 3 refills | Status: AC
Start: 2023-12-15 — End: ?

## 2023-12-15 NOTE — Patient Instructions (Addendum)
 Standing Labs We placed an order today for your standing lab work.   Please have your standing labs drawn in July and every 3 months  Please have your labs drawn 2 weeks prior to your appointment so that the provider can discuss your lab results at your appointment, if possible.  Please note that you may see your imaging and lab results in MyChart before we have reviewed them. We will contact you once all results are reviewed. Please allow our office up to 72 hours to thoroughly review all of the results before contacting the office for clarification of your results.  WALK-IN LAB HOURS  Monday through Thursday from 8:00 am -12:30 pm and 1:00 pm-5:00 pm and Friday from 8:00 am-12:00 pm.  Patients with office visits requiring labs will be seen before walk-in labs.  You may encounter longer than normal wait times. Please allow additional time. Wait times may be shorter on  Monday and Thursday afternoons.  We do not book appointments for walk-in labs. We appreciate your patience and understanding with our staff.   Labs are drawn by [facility]. Please bring your co-pay at the time of your lab draw.  You may receive a bill from [facility] for your lab work.  Please note if you are on Hydroxychloroquine and and an order has been placed for a Hydroxychloroquine level,  you will need to have it drawn 4 hours or more after your last dose.  If you wish to have your labs drawn at another location, please call the office 24 hours in advance so we can fax the orders.  The office is located at [address], [address], [place], [place] [address]   If you have any questions regarding directions or hours of operation,  please call [phone].   As a reminder, please drink plenty of water prior to coming for your lab work. Thanks!   Vaccines You are taking a medication(s) that can suppress your immune system.  The following immunizations are recommended: Flu annually Covid-19  Td/Tdap (tetanus, diphtheria,  pertussis) every 10 years Pneumonia (Prevnar 15 then Pneumovax 23 at least 1 year apart.  Alternatively, can take Prevnar 20 without needing additional dose) Shingrix: 2 doses from 4 weeks to 6 months apart  Please check with your PCP to make sure you are up to date.   If you have signs or symptoms of an infection or start antibiotics: First, call your PCP for workup of your infection. Hold your medication through the infection, until you complete your antibiotics, and until symptoms resolve if you take the following: Injectable medication (Actemra, Benlysta, Cimzia, Cosentyx, Enbrel, Humira, Kevzara, Orencia, Remicade, Simponi, Stelara, Taltz, Tremfya) Methotrexate Leflunomide (Arava) Mycophenolate (Cellcept) [place]borne [place], or Rinvoq location management

## 2023-12-16 LAB — CBC WITH DIFFERENTIAL/PLATELET
Absolute Lymphocytes: 2028 {cells}/uL (ref 850–3900)
Absolute Monocytes: 442 {cells}/uL (ref 200–950)
Basophils Absolute: 52 {cells}/uL (ref 0–200)
Basophils Relative: 1 %
Eosinophils Absolute: 333 {cells}/uL (ref 15–500)
Eosinophils Relative: 6.4 %
HCT: 39.2 % (ref 38.5–50.0)
Hemoglobin: 13.4 g/dL (ref 13.2–17.1)
MCH: 31.3 pg (ref 27.0–33.0)
MCHC: 34.2 g/dL (ref 32.0–36.0)
MCV: 91.6 fL (ref 80.0–100.0)
MPV: 9.2 fL (ref 7.5–12.5)
Monocytes Relative: 8.5 %
Neutro Abs: 2345 {cells}/uL (ref 1500–7800)
Neutrophils Relative %: 45.1 %
Platelets: 180 10*3/uL (ref 140–400)
RBC: 4.28 10*6/uL (ref 4.20–5.80)
RDW: 12.7 % (ref 11.0–15.0)
Total Lymphocyte: 39 %
WBC: 5.2 10*3/uL (ref 3.8–10.8)

## 2023-12-16 LAB — COMPREHENSIVE METABOLIC PANEL WITH GFR
AG Ratio: 1.9 (calc) (ref 1.0–2.5)
ALT: 16 U/L (ref 9–46)
AST: 18 U/L (ref 10–35)
Albumin: 4.4 g/dL (ref 3.6–5.1)
Alkaline phosphatase (APISO): 52 U/L (ref 35–144)
BUN/Creatinine Ratio: 24 (calc) — ABNORMAL HIGH (ref 6–22)
BUN: 27 mg/dL — ABNORMAL HIGH (ref 7–25)
CO2: 30 mmol/L (ref 20–32)
Calcium: 9.2 mg/dL (ref 8.6–10.3)
Chloride: 102 mmol/L (ref 98–110)
Creat: 1.11 mg/dL (ref 0.70–1.30)
Globulin: 2.3 g/dL (ref 1.9–3.7)
Glucose, Bld: 100 mg/dL — ABNORMAL HIGH (ref 65–99)
Potassium: 4.3 mmol/L (ref 3.5–5.3)
Sodium: 137 mmol/L (ref 135–146)
Total Bilirubin: 0.4 mg/dL (ref 0.2–1.2)
Total Protein: 6.7 g/dL (ref 6.1–8.1)
eGFR: 78 mL/min/{1.73_m2} (ref >=60)

## 2023-12-16 LAB — VITAMIN D 25 HYDROXY (VIT D DEFICIENCY, FRACTURES): Vit D, 25-Hydroxy: 45 ng/mL (ref 30–100)

## 2023-12-16 NOTE — Progress Notes (Signed)
CBC, CMP, vitamin D are within normal limits.

## 2024-01-22 DIAGNOSIS — M5127 Other intervertebral disc displacement, lumbosacral region: Secondary | ICD-10-CM | POA: Diagnosis not present

## 2024-01-22 DIAGNOSIS — M5416 Radiculopathy, lumbar region: Secondary | ICD-10-CM | POA: Diagnosis not present

## 2024-02-10 DIAGNOSIS — M5127 Other intervertebral disc displacement, lumbosacral region: Secondary | ICD-10-CM | POA: Diagnosis not present

## 2024-04-12 DIAGNOSIS — D869 Sarcoidosis, unspecified: Secondary | ICD-10-CM | POA: Diagnosis not present

## 2024-04-12 DIAGNOSIS — Z0000 Encounter for general adult medical examination without abnormal findings: Secondary | ICD-10-CM | POA: Diagnosis not present

## 2024-04-12 DIAGNOSIS — Z23 Encounter for immunization: Secondary | ICD-10-CM | POA: Diagnosis not present

## 2024-04-12 DIAGNOSIS — M47816 Spondylosis without myelopathy or radiculopathy, lumbar region: Secondary | ICD-10-CM | POA: Diagnosis not present

## 2024-04-12 DIAGNOSIS — R5383 Other fatigue: Secondary | ICD-10-CM | POA: Diagnosis not present

## 2024-04-12 DIAGNOSIS — E559 Vitamin D deficiency, unspecified: Secondary | ICD-10-CM | POA: Diagnosis not present

## 2024-04-12 DIAGNOSIS — G4700 Insomnia, unspecified: Secondary | ICD-10-CM | POA: Diagnosis not present

## 2024-04-12 DIAGNOSIS — Z79899 Other long term (current) drug therapy: Secondary | ICD-10-CM | POA: Diagnosis not present

## 2024-04-20 DIAGNOSIS — R7303 Prediabetes: Secondary | ICD-10-CM | POA: Diagnosis not present

## 2024-05-31 NOTE — Progress Notes (Signed)
 Office Visit Note  Alexander Nelson: Alexander Nelson             Date of Birth: Apr 17, 1966           MRN: [id number]             PCP: Evalene Sharron LABOR, MD Referring: Evalene Sharron LABOR, MD Visit Date: 06/14/2024 Occupation: Data Unavailable  Subjective:  Medication monitoring   History of Present Illness: Alexander Nelson is a 58 y.o. male with history of sarcoidosis.  Alexander Nelson is currently taking methotrexate  2 tablets by mouth once weekly and folic acid  1 mg daily.  Alexander Nelson continues to tolerate methotrexate  without any side effects and has not had any gaps in therapy.  Alexander Nelson denies any signs or symptoms of a flare.  Alexander Nelson has not had any recurrence of uveitis.  Alexander Nelson denies any EN lesions or other rashes.  Alexander Nelson denies any signs or symptoms of inflammatory arthritis.  Alexander Nelson remains active exercising on a daily basis.  Alexander Nelson denies any morning stiffness, nocturnal pain, or difficulty performing ADLs.  Alexander Nelson denies any new medical conditions.  Alexander Nelson denies any recent or recurrent infections.   Activities of Daily Living:  Alexander Nelson reports morning stiffness for 0 minute.   Alexander Nelson Denies nocturnal pain.  Difficulty dressing/grooming: Denies Difficulty climbing stairs: Denies Difficulty getting out of chair: Denies Difficulty using hands for taps, buttons, cutlery, and/or writing: Denies  Review of Systems  Constitutional:  Negative for fatigue.  HENT:  Negative for mouth sores and mouth dryness.   Eyes:  Negative for dryness.  Respiratory:  Negative for shortness of breath.   Cardiovascular:  Negative for chest pain and palpitations.  Gastrointestinal:  Negative for blood in stool, constipation and diarrhea.  Endocrine: Negative for increased urination.  Genitourinary:  Negative for involuntary urination.  Musculoskeletal:  Negative for joint pain, gait problem, joint pain, joint swelling, myalgias, muscle weakness, morning stiffness, muscle tenderness and myalgias.  Skin:  Negative for color change, rash, hair loss and  sensitivity to sunlight.  Allergic/Immunologic: Negative for susceptible to infections.  Neurological:  Negative for dizziness and headaches.  Hematological:  Negative for swollen glands.  Psychiatric/Behavioral:  Negative for depressed mood and sleep disturbance. The Alexander Nelson is not nervous/anxious.     PMFS History:  Alexander Nelson Active Problem List   Diagnosis Date Noted   Vitamin D  deficiency 12/17/2016   High risk medication use 12/16/2016   Uveitis 12/16/2016   Sarcoidosis 10/18/2014    Past Medical History:  Diagnosis Date   Abnormal chest x-ray 3 weeks ago   Bulging lumbar disc    L4, L5   Sarcoidosis     Family History  Problem Relation Age of Onset   Diabetes Mother    Heart attack Father    Kidney disease Brother    Healthy Daughter    Healthy Daughter    Healthy Daughter    Past Surgical History:  Procedure Laterality Date   CATARACT EXTRACTION Left    ENDOBRONCHIAL ULTRASOUND Bilateral 11/14/2014   Procedure: ENDOBRONCHIAL ULTRASOUND;  Surgeon: Francis CHRISTELLA Dresser, MD;  Location: [facility];  Service: Endoscopy;  Laterality: Bilateral;   INGUINAL HERNIA REPAIR Bilateral 04/04/2016   Procedure: LAPAROSCOPIC BILATERAL INGUINAL HERNIA REPAIR;  Surgeon: Krystal Russell, MD;  Location: [facility];  Service: General;  Laterality: Bilateral;   INSERTION OF MESH Bilateral 04/04/2016   Procedure: INSERTION OF MESH;  Surgeon: Krystal Russell, MD;  Location: [facility];  Service: General;  Laterality: Bilateral;   REFRACTIVE SURGERY  2002  TONSILLECTOMY  age 48   Social History   Tobacco Use   Smoking status: Never    Passive exposure: Never   Smokeless tobacco: Never  Vaping Use   Vaping status: Never Used  Substance Use Topics   Alcohol use: Yes    Alcohol/week: 3.0 standard drinks of alcohol    Types: 1 Glasses of wine, 1 Cans of beer, 1 Shots of liquor per week    Comment: occ   Drug use: No   Social History   Social History Narrative   Not on file     Immunization  History  Administered Date(s) Administered   Influenza Split 06/24/2015   Influenza,inj,Quad PF,6+ Mos 06/03/2016   Influenza-Unspecified 07/10/2014   PFIZER(Purple Top)SARS-COV-2 Vaccination 12/04/2019, 12/12/2019, 08/05/2020     Objective: Vital Signs: BP 109/66   Pulse (!) 52   Temp 98.3 F (36.8 C)   Resp 14   Ht 6' 1 (1.854 m)   Wt 185 lb 3.2 oz (84 kg)   BMI 24.43 kg/m    Physical Exam Vitals and nursing note reviewed.  Constitutional:      Appearance: Alexander Nelson is well-developed.  HENT:     Head: Normocephalic and atraumatic.  Eyes:     Conjunctiva/sclera: Conjunctivae normal.     Pupils: Pupils are equal, round, and reactive to light.  Cardiovascular:     Rate and Rhythm: Normal rate and regular rhythm.     Heart sounds: Normal heart sounds.  Pulmonary:     Effort: Pulmonary effort is normal.     Breath sounds: Normal breath sounds.  Abdominal:     General: Bowel sounds are normal.     Palpations: Abdomen is soft.  Musculoskeletal:     Cervical back: Normal range of motion and neck supple.  Skin:    General: Skin is warm and dry.     Capillary Refill: Capillary refill takes less than 2 seconds.  Neurological:     Mental Status: Alexander Nelson is alert and oriented to person, place, and time.  Psychiatric:        Behavior: Behavior normal.      Musculoskeletal Exam: C-spine, thoracic spine, lumbar spine have good range of motion.  No midline spinal tenderness.  No SI joint tenderness.  Shoulder joints, elbow joints, wrist joints, MCPs, PIPs, DIPs have good range of motion with no synovitis.  PIP and DIP thickening consistent with osteoarthritis of both hands.  Complete fist formation bilaterally.  Hip joints have good range of motion with no groin pain.  Knee joints have good range of motion no warmth or effusion.  Ankle joints have good range of motion no tenderness or joint swelling.  No evidence of Achilles tendinitis or plantar fasciitis.   CDAI Exam: CDAI Score:  -- Alexander Nelson Global: --; Provider Global: -- Swollen: --; Tender: -- Joint Exam 06/14/2024   No joint exam has been documented for this visit   There is currently no information documented on the homunculus. Go to the Rheumatology activity and complete the homunculus joint exam.  Investigation: No additional findings.  Imaging: No results found.  Recent Labs: Lab Results  Component Value Date   WBC 5.2 12/15/2023   HGB 13.4 12/15/2023   PLT 180 12/15/2023   NA 137 12/15/2023   K 4.3 12/15/2023   CL 102 12/15/2023   CO2 30 12/15/2023   GLUCOSE 100 (H) 12/15/2023   BUN 27 (H) 12/15/2023   CREATININE 1.11 12/15/2023   BILITOT 0.4 12/15/2023  ALKPHOS 65 12/23/2016   AST 18 12/15/2023   ALT 16 12/15/2023   PROT 6.7 12/15/2023   ALBUMIN 4.3 12/23/2016   CALCIUM 9.2 12/15/2023   GFRAA 90 10/30/2020    Speciality Comments: No specialty comments available.  Procedures:  No procedures performed Allergies: Bee venom   Assessment / Plan:     Visit Diagnoses: Sarcoidosis - No recurrence.  Alexander Nelson initially presented in 2017 with pulmonary sarcoidosis, inflammatory arthritis and uveitis: Alexander Nelson has not had any signs or symptoms of a flare.  Alexander Nelson has clinically been doing well on low-dose methotrexate  2 tablets by mouth once weekly and folic acid  1 mg daily.  No synovitis.  No signs or symptoms of uveitis.  Alexander Nelson is following up with Dr. Jarold on a yearly basis.  No EN lesions or other rashes.  No new or worsening pulmonary symptoms.  Alexander Nelson was advised to notify us  if Alexander Nelson develops any signs or symptoms of a flare.   Alexander Nelson will follow up in 6 months or sooner if needed.  - Plan: methotrexate  (RHEUMATREX) 2.5 MG tablet  High risk medication use - Methotrexate  2.5 mg 2 tablets by mouth once weekly and folic acid  1 mg daily. CBC and CMP updated on 12/15/23. Orders for CBC and CMP released today.  No recent or recurrent infections. Discussed the importance of holding methotrexate  if Alexander Nelson develops signs  or symptoms of an infection and to resume once the infection has completely cleared.    - Plan: CBC with Differential/Platelet, Comprehensive metabolic panel with GFR  Uveitis - Alexander Nelson.  No signs or symptoms of a recurrence.  No eye pain, photophobia, or conjunctival injection.  Alexander Nelson remains on low-dose methotrexate  2 tablets by mouth once weekly.  A refill was sent to the pharmacy today.  Alexander Nelson was advised to notify us  if Alexander Nelson develops any signs or symptoms of a flare.  Vitamin D  deficiency: Alexander Nelson is taking vitamin D  1000 units daily.   Other medical conditions are listed as follows:   History of cataract extraction  Pain in right hand: No synovitis noted.    Orders: Orders Placed This Encounter  Procedures   CBC with Differential/Platelet   Comprehensive metabolic panel with GFR   Meds ordered this encounter  Medications   methotrexate  (RHEUMATREX) 2.5 MG tablet    Sig: Take 2 tablets by mouth once weekly. Caution:Chemotherapy. Protect from light.    Dispense:  24 tablet    Refill:  0    Follow-Up Instructions: Return in about 6 months (around 12/13/2024) for Sarcoidosis.   Alexander CHRISTELLA Craze, Alexander Nelson  Note - This record has been created using Dragon software.  Chart creation errors have been sought, but may not always  have been located. Such creation errors do not reflect on  the standard of medical care.

## 2024-06-14 ENCOUNTER — Ambulatory Visit: Attending: Physician Assistant | Admitting: Physician Assistant

## 2024-06-14 ENCOUNTER — Encounter: Payer: Self-pay | Admitting: Physician Assistant

## 2024-06-14 VITALS — BP 109/66 | HR 52 | Temp 98.3°F | Resp 14 | Ht 73.0 in | Wt 185.2 lb

## 2024-06-14 DIAGNOSIS — D869 Sarcoidosis, unspecified: Secondary | ICD-10-CM

## 2024-06-14 DIAGNOSIS — Z9849 Cataract extraction status, unspecified eye: Secondary | ICD-10-CM

## 2024-06-14 DIAGNOSIS — M79641 Pain in right hand: Secondary | ICD-10-CM

## 2024-06-14 DIAGNOSIS — Z79899 Other long term (current) drug therapy: Secondary | ICD-10-CM | POA: Diagnosis not present

## 2024-06-14 DIAGNOSIS — H209 Unspecified iridocyclitis: Secondary | ICD-10-CM | POA: Diagnosis not present

## 2024-06-14 DIAGNOSIS — E559 Vitamin D deficiency, unspecified: Secondary | ICD-10-CM

## 2024-06-14 LAB — COMPREHENSIVE METABOLIC PANEL WITH GFR
AG Ratio: 1.8 (calc) (ref 1.0–2.5)
ALT: 18 U/L (ref 9–46)
AST: 19 U/L (ref 10–35)
Albumin: 4.4 g/dL (ref 3.6–5.1)
Alkaline phosphatase (APISO): 50 U/L (ref 35–144)
BUN/Creatinine Ratio: 18 (calc) (ref 6–22)
BUN: 24 mg/dL (ref 7–25)
CO2: 31 mmol/L (ref 20–32)
Calcium: 9.6 mg/dL (ref 8.6–10.3)
Chloride: 102 mmol/L (ref 98–110)
Creat: 1.31 mg/dL — ABNORMAL HIGH (ref 0.70–1.30)
Globulin: 2.4 g/dL (ref 1.9–3.7)
Glucose, Bld: 102 mg/dL — ABNORMAL HIGH (ref 65–99)
Potassium: 5 mmol/L (ref 3.5–5.3)
Sodium: 138 mmol/L (ref 135–146)
Total Bilirubin: 0.5 mg/dL (ref 0.2–1.2)
Total Protein: 6.8 g/dL (ref 6.1–8.1)
eGFR: 63 mL/min/1.73m2 (ref >=60)

## 2024-06-14 LAB — CBC WITH DIFFERENTIAL/PLATELET
Absolute Lymphocytes: 1677 {cells}/uL (ref 850–3900)
Absolute Monocytes: 456 {cells}/uL (ref 200–950)
Basophils Absolute: 52 {cells}/uL (ref 0–200)
Basophils Relative: 1.2 %
Eosinophils Absolute: 292 {cells}/uL (ref 15–500)
Eosinophils Relative: 6.8 %
HCT: 39.7 % (ref 38.5–50.0)
Hemoglobin: 13.4 g/dL (ref 13.2–17.1)
MCH: 31.5 pg (ref 27.0–33.0)
MCHC: 33.8 g/dL (ref 32.0–36.0)
MCV: 93.2 fL (ref 80.0–100.0)
MPV: 9.2 fL (ref 7.5–12.5)
Monocytes Relative: 10.6 %
Neutro Abs: 1823 {cells}/uL (ref 1500–7800)
Neutrophils Relative %: 42.4 %
Platelets: 175 Thousand/uL (ref 140–400)
RBC: 4.26 Million/uL (ref 4.20–5.80)
RDW: 12.6 % (ref 11.0–15.0)
Total Lymphocyte: 39 %
WBC: 4.3 Thousand/uL (ref 3.8–10.8)

## 2024-06-14 MED ORDER — METHOTREXATE SODIUM 2.5 MG PO TABS: ORAL_TABLET | ORAL | 0 refills | Status: AC

## 2024-06-14 NOTE — Patient Instructions (Signed)
 Standing Labs We placed an order today for your standing lab work.   Please have your standing labs drawn in January and every 3 months   Please have your labs drawn 2 weeks prior to your appointment so that the provider can discuss your lab results at your appointment, if possible.  Please note that you may see your imaging and lab results in MyChart before we have reviewed them. We will contact you once all results are reviewed. Please allow our office up to 72 hours to thoroughly review all of the results before contacting the office for clarification of your results.  WALK-IN LAB HOURS  Monday through Thursday from 8:00 am -12:30 pm and 1:00 pm-4:30 pm and Friday from 8:00 am-12:00 pm.  Patients with office visits requiring labs will be seen before walk-in labs.  You may encounter longer than normal wait times. Please allow additional time. Wait times may be shorter on  Monday and Thursday afternoons.  We do not book appointments for walk-in labs. We appreciate your patience and understanding with our staff.   Labs are drawn by [facility]. Please bring your co-pay at the time of your lab draw.  You may receive a bill from [facility] for your lab work.  Please note if you are on Hydroxychloroquine  and and an order has been placed for a Hydroxychloroquine  level,  you will need to have it drawn 4 hours or more after your last dose.  If you wish to have your labs drawn at another location, please call the office 24 hours in advance so we can fax the orders.  The office is located at [address], [address], [place], [place] [address]   If you have any questions regarding directions or hours of operation,  please call [phone].   As a reminder, please drink plenty of water prior to coming for your lab work. Thanks!

## 2024-06-15 ENCOUNTER — Ambulatory Visit: Payer: Self-pay | Admitting: Physician Assistant

## 2024-06-15 NOTE — Progress Notes (Signed)
 Recommend increasing water intake and avoiding the use of NSAIDs.  Recommend repeating BMP with GFR in 2 to 3 weeks--okay to have labs closer to home.

## 2024-06-15 NOTE — Progress Notes (Signed)
 CBC WNL Creatinine is elevated-1.31.  GFR has decreased from 78 to 63. Please clarify if he has been taking any NSAIDs? Any other medication changes? Dehydration?

## 2024-06-22 DIAGNOSIS — H6090 Unspecified otitis externa, unspecified ear: Secondary | ICD-10-CM | POA: Diagnosis not present

## 2024-06-22 DIAGNOSIS — M25571 Pain in right ankle and joints of right foot: Secondary | ICD-10-CM | POA: Diagnosis not present

## 2024-12-20 ENCOUNTER — Ambulatory Visit: Admitting: Rheumatology
